# Patient Record
Sex: Female | Born: 1950 | Race: White | Hispanic: No | Marital: Married | Smoking: Former smoker
Health system: Southern US, Community
[De-identification: ages and names within clinical notes are randomized; demographics above are authoritative.]

## PROBLEM LIST (undated history)

## (undated) DIAGNOSIS — C801 Malignant (primary) neoplasm, unspecified: Secondary | ICD-10-CM

## (undated) DIAGNOSIS — L438 Other lichen planus: Secondary | ICD-10-CM

## (undated) DIAGNOSIS — M810 Age-related osteoporosis without current pathological fracture: Secondary | ICD-10-CM

## (undated) HISTORY — PX: RETINAL TEAR REPAIR CRYOTHERAPY: SHX5304

## (undated) HISTORY — DX: Other lichen planus: L43.8

## (undated) HISTORY — PX: TONSILLECTOMY AND ADENOIDECTOMY: SHX28

## (undated) HISTORY — PX: SKIN CANCER EXCISION: SHX779

## (undated) HISTORY — DX: Age-related osteoporosis without current pathological fracture: M81.0

## (undated) HISTORY — DX: Malignant (primary) neoplasm, unspecified: C80.1

---

## 1978-06-24 HISTORY — PX: TUBAL LIGATION: SHX77

## 1988-06-24 HISTORY — PX: OTHER SURGICAL HISTORY: SHX169

## 1999-03-30 ENCOUNTER — Other Ambulatory Visit: Admission: RE | Admit: 1999-03-30 | Discharge: 1999-03-30 | Payer: Self-pay | Admitting: Family Medicine

## 1999-05-02 ENCOUNTER — Encounter: Payer: Self-pay | Admitting: Family Medicine

## 1999-05-02 ENCOUNTER — Encounter: Admission: RE | Admit: 1999-05-02 | Discharge: 1999-05-02 | Payer: Self-pay | Admitting: Family Medicine

## 1999-08-08 ENCOUNTER — Encounter: Admission: RE | Admit: 1999-08-08 | Discharge: 1999-08-08 | Payer: Self-pay | Admitting: Family Medicine

## 1999-08-08 ENCOUNTER — Encounter: Payer: Self-pay | Admitting: Family Medicine

## 1999-08-23 ENCOUNTER — Other Ambulatory Visit: Admission: RE | Admit: 1999-08-23 | Discharge: 1999-08-23 | Payer: Self-pay | Admitting: Obstetrics and Gynecology

## 1999-08-23 ENCOUNTER — Encounter (INDEPENDENT_AMBULATORY_CARE_PROVIDER_SITE_OTHER): Payer: Self-pay | Admitting: Specialist

## 2000-05-23 ENCOUNTER — Other Ambulatory Visit: Admission: RE | Admit: 2000-05-23 | Discharge: 2000-05-23 | Payer: Self-pay | Admitting: Obstetrics and Gynecology

## 2000-06-25 ENCOUNTER — Encounter: Admission: RE | Admit: 2000-06-25 | Discharge: 2000-06-25 | Payer: Self-pay | Admitting: Family Medicine

## 2000-06-25 ENCOUNTER — Encounter: Payer: Self-pay | Admitting: Family Medicine

## 2000-11-28 ENCOUNTER — Ambulatory Visit (HOSPITAL_COMMUNITY): Admission: RE | Admit: 2000-11-28 | Discharge: 2000-11-28 | Payer: Self-pay | Admitting: *Deleted

## 2001-07-23 ENCOUNTER — Other Ambulatory Visit: Admission: RE | Admit: 2001-07-23 | Discharge: 2001-07-23 | Payer: Self-pay | Admitting: Obstetrics and Gynecology

## 2001-08-28 ENCOUNTER — Encounter: Admission: RE | Admit: 2001-08-28 | Discharge: 2001-08-28 | Payer: Self-pay | Admitting: Obstetrics and Gynecology

## 2001-08-28 ENCOUNTER — Encounter: Payer: Self-pay | Admitting: Obstetrics and Gynecology

## 2001-12-20 ENCOUNTER — Emergency Department (HOSPITAL_COMMUNITY): Admission: EM | Admit: 2001-12-20 | Discharge: 2001-12-20 | Payer: Self-pay | Admitting: Emergency Medicine

## 2002-07-30 ENCOUNTER — Other Ambulatory Visit: Admission: RE | Admit: 2002-07-30 | Discharge: 2002-07-30 | Payer: Self-pay | Admitting: Obstetrics and Gynecology

## 2002-09-03 ENCOUNTER — Encounter: Payer: Self-pay | Admitting: Obstetrics and Gynecology

## 2002-09-03 ENCOUNTER — Encounter: Admission: RE | Admit: 2002-09-03 | Discharge: 2002-09-03 | Payer: Self-pay | Admitting: Obstetrics and Gynecology

## 2003-08-26 ENCOUNTER — Other Ambulatory Visit: Admission: RE | Admit: 2003-08-26 | Discharge: 2003-08-26 | Payer: Self-pay | Admitting: Obstetrics and Gynecology

## 2003-09-09 ENCOUNTER — Encounter: Admission: RE | Admit: 2003-09-09 | Discharge: 2003-09-09 | Payer: Self-pay | Admitting: Obstetrics and Gynecology

## 2004-09-14 ENCOUNTER — Other Ambulatory Visit: Admission: RE | Admit: 2004-09-14 | Discharge: 2004-09-14 | Payer: Self-pay | Admitting: Obstetrics and Gynecology

## 2004-09-14 ENCOUNTER — Encounter: Admission: RE | Admit: 2004-09-14 | Discharge: 2004-09-14 | Payer: Self-pay | Admitting: Obstetrics and Gynecology

## 2005-01-10 ENCOUNTER — Encounter: Admission: RE | Admit: 2005-01-10 | Discharge: 2005-01-10 | Payer: Self-pay | Admitting: Obstetrics and Gynecology

## 2005-10-09 ENCOUNTER — Other Ambulatory Visit: Admission: RE | Admit: 2005-10-09 | Discharge: 2005-10-09 | Payer: Self-pay | Admitting: Obstetrics & Gynecology

## 2006-01-22 ENCOUNTER — Encounter: Admission: RE | Admit: 2006-01-22 | Discharge: 2006-01-22 | Payer: Self-pay | Admitting: Obstetrics and Gynecology

## 2006-01-29 ENCOUNTER — Encounter: Admission: RE | Admit: 2006-01-29 | Discharge: 2006-01-29 | Payer: Self-pay | Admitting: Obstetrics and Gynecology

## 2006-10-30 ENCOUNTER — Other Ambulatory Visit: Admission: RE | Admit: 2006-10-30 | Discharge: 2006-10-30 | Payer: Self-pay | Admitting: Obstetrics and Gynecology

## 2007-02-27 ENCOUNTER — Encounter: Admission: RE | Admit: 2007-02-27 | Discharge: 2007-02-27 | Payer: Self-pay | Admitting: Obstetrics and Gynecology

## 2007-12-08 ENCOUNTER — Other Ambulatory Visit: Admission: RE | Admit: 2007-12-08 | Discharge: 2007-12-08 | Payer: Self-pay | Admitting: Obstetrics and Gynecology

## 2008-03-15 ENCOUNTER — Encounter: Admission: RE | Admit: 2008-03-15 | Discharge: 2008-03-15 | Payer: Self-pay | Admitting: Obstetrics and Gynecology

## 2009-03-21 ENCOUNTER — Encounter: Admission: RE | Admit: 2009-03-21 | Discharge: 2009-03-21 | Payer: Self-pay | Admitting: Internal Medicine

## 2009-03-22 ENCOUNTER — Encounter: Admission: RE | Admit: 2009-03-22 | Discharge: 2009-03-22 | Payer: Self-pay | Admitting: Internal Medicine

## 2010-03-22 ENCOUNTER — Encounter: Admission: RE | Admit: 2010-03-22 | Discharge: 2010-03-22 | Payer: Self-pay | Admitting: Obstetrics and Gynecology

## 2010-07-15 ENCOUNTER — Encounter: Payer: Self-pay | Admitting: Obstetrics and Gynecology

## 2010-07-16 ENCOUNTER — Encounter: Payer: Self-pay | Admitting: Internal Medicine

## 2011-02-27 ENCOUNTER — Other Ambulatory Visit: Payer: Self-pay | Admitting: Obstetrics and Gynecology

## 2011-02-27 DIAGNOSIS — Z1231 Encounter for screening mammogram for malignant neoplasm of breast: Secondary | ICD-10-CM

## 2011-03-25 ENCOUNTER — Ambulatory Visit
Admission: RE | Admit: 2011-03-25 | Discharge: 2011-03-25 | Disposition: A | Discharge status: Home / Self Care | Payer: 59 | Source: Ambulatory Visit | Attending: Obstetrics and Gynecology | Admitting: Obstetrics and Gynecology

## 2011-03-25 DIAGNOSIS — Z1231 Encounter for screening mammogram for malignant neoplasm of breast: Secondary | ICD-10-CM

## 2012-03-03 ENCOUNTER — Other Ambulatory Visit: Payer: Self-pay | Admitting: Obstetrics and Gynecology

## 2012-03-03 DIAGNOSIS — M810 Age-related osteoporosis without current pathological fracture: Secondary | ICD-10-CM

## 2012-03-12 ENCOUNTER — Other Ambulatory Visit: Payer: Self-pay | Admitting: Obstetrics and Gynecology

## 2012-03-12 DIAGNOSIS — Z1231 Encounter for screening mammogram for malignant neoplasm of breast: Secondary | ICD-10-CM

## 2012-03-24 DIAGNOSIS — M810 Age-related osteoporosis without current pathological fracture: Secondary | ICD-10-CM

## 2012-03-24 HISTORY — DX: Age-related osteoporosis without current pathological fracture: M81.0

## 2012-04-14 ENCOUNTER — Ambulatory Visit
Admission: RE | Admit: 2012-04-14 | Discharge: 2012-04-14 | Disposition: A | Discharge status: Home / Self Care | Payer: 59 | Source: Ambulatory Visit | Attending: Obstetrics and Gynecology | Admitting: Obstetrics and Gynecology

## 2012-04-14 DIAGNOSIS — Z1231 Encounter for screening mammogram for malignant neoplasm of breast: Secondary | ICD-10-CM

## 2012-04-14 DIAGNOSIS — M810 Age-related osteoporosis without current pathological fracture: Secondary | ICD-10-CM

## 2013-01-25 ENCOUNTER — Encounter: Payer: Self-pay | Admitting: *Deleted

## 2013-01-25 ENCOUNTER — Ambulatory Visit (INDEPENDENT_AMBULATORY_CARE_PROVIDER_SITE_OTHER): Payer: 59 | Admitting: Obstetrics and Gynecology

## 2013-01-25 ENCOUNTER — Encounter: Payer: Self-pay | Admitting: Obstetrics and Gynecology

## 2013-01-25 VITALS — BP 120/70 | HR 80 | Resp 20 | Ht 64.0 in | Wt 134.0 lb

## 2013-01-25 DIAGNOSIS — Z01419 Encounter for gynecological examination (general) (routine) without abnormal findings: Secondary | ICD-10-CM

## 2013-01-25 MED ORDER — RISEDRONATE SODIUM 35 MG PO TABS: 35.0000 mg | ORAL_TABLET | ORAL | Status: DC

## 2013-01-25 MED ORDER — ESTROGENS, CONJUGATED 0.625 MG/GM VA CREA: TOPICAL_CREAM | VAGINAL | Status: DC

## 2013-01-25 NOTE — Progress Notes (Signed)
62 y.o.   Married    Caucasian   female   G2P2002   here for annual exam.  Doing fine back on actonel.  Takes vit D qd.  Does exercise.    Patient's last menstrual period was 06/24/2002.          Sexually active: yes  The current method of family planning is tubal ligation and post menopausal status.    Exercising: walking dog twice a day, gardening Last mammogram:  04/14/12 normal Last pap smear: 01/03/10 neg History of abnormal pap: no Smoking: quit smoking 25 years ago Alcohol: 2-3 glasses of wine a week Last colonoscopy:2012 normal, repeat in 5 years (family history) Last Bone Density: 04/14/12 osteopenia, osteoporosis of the hip  Last tetanus shot: 2010 Last cholesterol check: 2013 total around 200  Hgb:  pcp              Urine: pcp   Family History  Problem Relation Age of Onset  . Hypertension Mother   . Atrial fibrillation Mother   . Dementia Mother   . Hypertension Father   . Cancer Father     colon  . Diabetes Maternal Grandmother   . Cancer Paternal Grandmother     There are no active problems to display for this patient.   Past Medical History  Diagnosis Date  . Osteoporosis 03-2012    T-score 1.5/2.8     Past Surgical History  Procedure Laterality Date  . Tubal ligation  1980  . Salivatory gland  1990    Blocked salivatory gland removed  . Tonsillectomy and adenoidectomy  Age 70    Allergies: Review of patient's allergies indicates no known allergies.  Current Outpatient Prescriptions  Medication Sig Dispense Refill  . calcium carbonate (OS-CAL) 600 MG TABS tablet Take 600 mg by mouth daily.       . cholecalciferol (VITAMIN D) 1000 UNITS tablet Take 2,000 Units by mouth daily.      Marland Kitchen conjugated estrogens (PREMARIN) vaginal cream Place vaginally 3 (three) times a week.      . diclofenac (VOLTAREN) 75 MG EC tablet       . lansoprazole (PREVACID) 15 MG capsule Take 15 mg by mouth as needed.      . predniSONE (DELTASONE) 10 MG tablet       . ACTONEL  35 MG tablet once a week.       . fish oil-omega-3 fatty acids 1000 MG capsule Take 1,500 mg by mouth daily.       No current facility-administered medications for this visit.  Likes her premarin cream.  Having hip pain and is on a prednisone dose pack for ? bursitis  ROS: Pertinent items are noted in HPI.  Social Hx:  Married, two children, retired Set designer  Exam:    BP 120/70  Pulse 80  Resp 20  Ht 5\' 4"  (1.626 m)  Wt 134 lb (60.782 kg)  BMI 22.99 kg/m2  LMP 01/01/2004ht stable, wt down 1 pound from last year   Wt Readings from Last 3 Encounters:  01/25/13 134 lb (60.782 kg)     Ht Readings from Last 3 Encounters:  01/25/13 5\' 4"  (1.626 m)    General appearance: alert, cooperative and appears stated age Head: Normocephalic, without obvious abnormality, atraumatic Neck: no adenopathy, supple, symmetrical, trachea midline and thyroid not enlarged, symmetric, no tenderness/mass/nodules Lungs: clear to auscultation bilaterally Breasts: Inspection negative, No nipple retraction or dimpling, No nipple discharge or bleeding, No axillary or supraclavicular  adenopathy, Normal to palpation without dominant masses Heart: regular rate and rhythm Abdomen: soft, non-tender; bowel sounds normal; no masses,  no organomegaly Extremities: extremities normal, atraumatic, no cyanosis or edema Skin: Skin color, texture, turgor normal. No rashes or lesions Lymph nodes: Cervical, supraclavicular, and axillary nodes normal. No abnormal inguinal nodes palpated Neurologic: Grossly normal   Pelvic: External genitalia:  no lesions              Urethra:  normal appearing urethra with no masses, tenderness or lesions              Bartholins and Skenes: normal                 Vagina: normal appearing vagina with normal color and discharge, no lesions              Cervix: normal appearance              Pap taken: yes        Bimanual Exam:  Uterus:  uterus is normal size, shape, consistency and  nontender, mid, mobile                                      Adnexa: normal adnexa in size, nontender and no masses                                      Rectovaginal: Confirms                                      Anus:  normal sphincter tone, no lesions  A: normal menopausal exam, no HRT     Osteoporosis, on actonel ( Quite in 2010 after 10 years of use;restarted in 2013 when left hip t score was -2.8)     P:     mammogram pap smear counseled on breast self exam, mammography screening, adequate intake of calcium and vitamin D, diet and exercise return annually or prn     An After Visit Summary was printed and given to the patient.

## 2013-01-25 NOTE — Patient Instructions (Signed)

## 2013-03-16 ENCOUNTER — Other Ambulatory Visit: Payer: Self-pay

## 2013-03-16 DIAGNOSIS — Z1231 Encounter for screening mammogram for malignant neoplasm of breast: Secondary | ICD-10-CM

## 2013-04-15 ENCOUNTER — Ambulatory Visit
Admission: RE | Admit: 2013-04-15 | Discharge: 2013-04-15 | Disposition: A | Discharge status: Home / Self Care | Payer: 59 | Source: Ambulatory Visit

## 2013-04-15 DIAGNOSIS — Z1231 Encounter for screening mammogram for malignant neoplasm of breast: Secondary | ICD-10-CM

## 2013-04-29 ENCOUNTER — Other Ambulatory Visit: Payer: Self-pay

## 2014-01-26 ENCOUNTER — Encounter: Payer: Self-pay | Admitting: Obstetrics and Gynecology

## 2014-01-26 ENCOUNTER — Ambulatory Visit (INDEPENDENT_AMBULATORY_CARE_PROVIDER_SITE_OTHER): Payer: 59 | Admitting: Obstetrics and Gynecology

## 2014-01-26 VITALS — BP 102/76 | HR 82 | Resp 14 | Ht 64.25 in | Wt 134.6 lb

## 2014-01-26 DIAGNOSIS — Z01419 Encounter for gynecological examination (general) (routine) without abnormal findings: Secondary | ICD-10-CM

## 2014-01-26 DIAGNOSIS — Z0000 Encounter for general adult medical examination without abnormal findings: Secondary | ICD-10-CM

## 2014-01-26 DIAGNOSIS — M810 Age-related osteoporosis without current pathological fracture: Secondary | ICD-10-CM

## 2014-01-26 LAB — POCT URINALYSIS DIPSTICK
Bilirubin, UA: NEGATIVE
Blood, UA: NEGATIVE
GLUCOSE UA: NEGATIVE
Ketones, UA: NEGATIVE
LEUKOCYTES UA: NEGATIVE
NITRITE UA: NEGATIVE
Protein, UA: NEGATIVE
Urobilinogen, UA: NEGATIVE
pH, UA: 6

## 2014-01-26 MED ORDER — RISEDRONATE SODIUM 35 MG PO TABS: 35.0000 mg | ORAL_TABLET | ORAL | Status: DC

## 2014-01-26 MED ORDER — ESTROGENS, CONJUGATED 0.625 MG/GM VA CREA: TOPICAL_CREAM | VAGINAL | Status: DC

## 2014-01-26 NOTE — Patient Instructions (Signed)

## 2014-01-26 NOTE — Progress Notes (Signed)
Patient ID: Hannah Jennings, female   DOB: 1951/04/09, 63 y.o.   MRN: [id number] GYNECOLOGY VISIT  PCP:   Collier Flowers, MD  Referring provider:   HPI: 63 y.o.   Married  Caucasian  female   G2P2002 with Patient's last menstrual period was 06/24/2002.   here for   AEX. Takes Actonel for osteoporosis of the hip. Took Actonel for many years and then did a drug holiday. Restarted in 2013.   Using Premarin vaginal cream for dryness.  Uses once a week.  Still has some hot flashes and night sweats.   Hgb:    PCP Urine:  Neg  GYNECOLOGIC HISTORY: Patient's last menstrual period was 06/24/2002. Sexually active:  yes Partner preference: female Contraception: Tubal   Menopausal hormone therapy: Premarin vaginal cream DES exposure:   no Blood transfusions:  no  Sexually transmitted diseases:  no GYN procedures and prior surgeries:  Tubal ligation Last mammogram:  04-15-13 fibroglandular density, otherwise normal:[facility]               Last pap and high risk HPV testing:   01-25-13 wnl:neg HR HPV  History of abnormal pap smear:  no   OB History   Grav Para Term Preterm Abortions TAB SAB Ect Mult Living   2 2 2       2        LIFESTYLE: Exercise:  no           Tobacco: no Alcohol:   4-5 glasses of wine per week Drug use:   no  OTHER HEALTH MAINTENANCE: Tetanus/TDap:  11/2007 Gardisil:             n/a Influenza:          03/2013 Zostavax:           2014  Bone density:  03-03-12 osteoporosis:[facility].  T score spine - 1.5, T score hip -2.8. Colonoscopy:  2012 normal with Dr. Cristina Gong.  Next colonoscopy due 2017 due to family history of colon cancer in her father.  Cholesterol check:   borderline  Family History  Problem Relation Age of Onset  . Hypertension Mother   . Atrial fibrillation Mother   . Dementia Mother   . Hypertension Father   . Cancer Father     colon  . Diabetes Maternal Grandmother   . Cancer Paternal Grandmother     There are no  active problems to display for this patient.  Past Medical History  Diagnosis Date  . Osteoporosis 03-2012    T-score 1.5/2.8     Past Surgical History  Procedure Laterality Date  . Tubal ligation  1980  . Salivatory gland  1990    Blocked salivatory gland removed  . Tonsillectomy and adenoidectomy  Age 67    ALLERGIES: Review of patient's allergies indicates no known allergies.  Current Outpatient Prescriptions  Medication Sig Dispense Refill  . calcium carbonate (OS-CAL) 600 MG TABS tablet Take 600 mg by mouth daily.       . cholecalciferol (VITAMIN D) 1000 UNITS tablet Take 2,000 Units by mouth daily.      Marland Kitchen conjugated estrogens (PREMARIN) vaginal cream Place vaginally 3 (three) times a week.  42.5 g  4  . lansoprazole (PREVACID) 15 MG capsule Take 15 mg by mouth as needed.      . risedronate (ACTONEL) 35 MG tablet Take 1 tablet (35 mg total) by mouth once a week.  4 tablet  12   No current  facility-administered medications for this visit.     ROS:  Pertinent items are noted in HPI.  SOCIAL HISTORY:  Retired. RN.  Worked in Librarian, academic.   PHYSICAL EXAMINATION:    BP 102/76  Pulse 82  Resp 14  Ht 5' 4.25" (1.632 m)  Wt 134 lb 9.6 oz (61.054 kg)  BMI 22.92 kg/m2  LMP 06/24/2002   Wt Readings from Last 3 Encounters:  01/26/14 134 lb 9.6 oz (61.054 kg)  01/25/13 134 lb (60.782 kg)     Ht Readings from Last 3 Encounters:  01/26/14 5' 4.25" (1.632 m)  01/25/13 5\' 4"  (1.626 m)    General appearance: alert, cooperative and appears stated age Head: Normocephalic, without obvious abnormality, atraumatic Neck: no adenopathy, supple, symmetrical, trachea midline and thyroid not enlarged, symmetric, no tenderness/mass/nodules Lungs: clear to auscultation bilaterally Breasts: Inspection negative, No nipple retraction or dimpling, No nipple discharge or bleeding, No axillary or supraclavicular adenopathy, Normal to palpation without dominant masses Heart: regular rate and  rhythm Abdomen: soft, non-tender; no masses,  no organomegaly Extremities: extremities normal, atraumatic, no cyanosis or edema Skin: Skin color, texture, turgor normal. No rashes or lesions Lymph nodes: Cervical, supraclavicular, and axillary nodes normal. No abnormal inguinal nodes palpated Neurologic: Grossly normal  Pelvic: External genitalia:  no lesions              Urethra:  normal appearing urethra with no masses, tenderness or lesions              Bartholins and Skenes: normal                 Vagina: normal appearing vagina with normal color and discharge, no lesions              Cervix: normal appearance              Pap and high risk HPV testing done: No.            Bimanual Exam:  Uterus:  uterus is normal size, shape, consistency and nontender                                      Adnexa: normal adnexa in size, nontender and no masses                                      Rectovaginal: Confirms                                      Anus:  normal sphincter tone, no lesions  ASSESSMENT  Normal gynecologic exam. Osteoporosis.  Atrophic vaginal changes.   PLAN  Mammogram recommended yearly.  Due this fall.  Pap smear and high risk HPV testing not indicated. Bone density in the fall 2015.  Refill Actonel 35 mg weekly.  See orders.  Counseled on self breast exam, Calcium and vitamin D intake, exercise. Check Vit D level.  Refill Premarin cream 1/2 gram 2 - 3 times weekly.  See orders.  Return annually or prn   An After Visit Summary was printed and given to the patient.

## 2014-01-27 LAB — VITAMIN D 25 HYDROXY (VIT D DEFICIENCY, FRACTURES): VIT D 25 HYDROXY: 57 ng/mL (ref 30–89)

## 2014-03-18 LAB — CBC AND DIFFERENTIAL
HCT: 42 % (ref 36–46)
Hemoglobin: 13.7 g/dL (ref 12.0–16.0)
Platelets: 282 10*3/uL (ref 150–399)
WBC: 5 10*3/mL

## 2014-03-18 LAB — BASIC METABOLIC PANEL
BUN: 20 mg/dL (ref 4–21)
CREATININE: 0.9 mg/dL (ref 0.5–1.1)
GLUCOSE: 94 mg/dL
POTASSIUM: 4.8 mmol/L (ref 3.4–5.3)
Sodium: 137 mmol/L (ref 137–147)

## 2014-03-18 LAB — TSH: TSH: 1.49 u[IU]/mL (ref 0.41–5.90)

## 2014-03-22 LAB — LIPID PANEL
CHOLESTEROL: 209 mg/dL — AB (ref 0–200)
HDL: 68 mg/dL (ref 35–70)
LDL Cholesterol: 122 mg/dL
TRIGLYCERIDES: 91 mg/dL (ref 40–160)

## 2014-03-22 LAB — TSH: TSH: 1.49 u[IU]/mL (ref 0.41–5.90)

## 2014-04-08 ENCOUNTER — Other Ambulatory Visit: Payer: Self-pay

## 2014-04-18 ENCOUNTER — Other Ambulatory Visit: Payer: Self-pay | Admitting: Obstetrics and Gynecology

## 2014-04-18 DIAGNOSIS — Z1231 Encounter for screening mammogram for malignant neoplasm of breast: Secondary | ICD-10-CM

## 2014-04-25 ENCOUNTER — Encounter: Payer: Self-pay | Admitting: Obstetrics and Gynecology

## 2014-05-11 ENCOUNTER — Ambulatory Visit
Admission: RE | Admit: 2014-05-11 | Discharge: 2014-05-11 | Disposition: A | Discharge status: Home / Self Care | Payer: 59 | Source: Ambulatory Visit | Attending: Obstetrics and Gynecology | Admitting: Obstetrics and Gynecology

## 2014-05-11 DIAGNOSIS — Z1231 Encounter for screening mammogram for malignant neoplasm of breast: Secondary | ICD-10-CM

## 2014-05-11 DIAGNOSIS — M810 Age-related osteoporosis without current pathological fracture: Secondary | ICD-10-CM

## 2015-02-01 ENCOUNTER — Ambulatory Visit (INDEPENDENT_AMBULATORY_CARE_PROVIDER_SITE_OTHER): Payer: Medicare Other | Admitting: Obstetrics and Gynecology

## 2015-02-01 ENCOUNTER — Encounter: Payer: Self-pay | Admitting: Obstetrics and Gynecology

## 2015-02-01 ENCOUNTER — Ambulatory Visit: Payer: 59 | Admitting: Obstetrics and Gynecology

## 2015-02-01 DIAGNOSIS — E559 Vitamin D deficiency, unspecified: Secondary | ICD-10-CM | POA: Diagnosis not present

## 2015-02-01 DIAGNOSIS — Z01419 Encounter for gynecological examination (general) (routine) without abnormal findings: Secondary | ICD-10-CM

## 2015-02-01 DIAGNOSIS — Z0000 Encounter for general adult medical examination without abnormal findings: Secondary | ICD-10-CM | POA: Diagnosis not present

## 2015-02-01 LAB — POCT URINALYSIS DIPSTICK
Bilirubin, UA: NEGATIVE
Blood, UA: NEGATIVE
GLUCOSE UA: NEGATIVE
KETONES UA: NEGATIVE
Leukocytes, UA: NEGATIVE
Nitrite, UA: NEGATIVE
Protein, UA: NEGATIVE
UROBILINOGEN UA: NEGATIVE
pH, UA: 5

## 2015-02-01 MED ORDER — RISEDRONATE SODIUM 35 MG PO TABS: 35.0000 mg | ORAL_TABLET | ORAL | Status: DC

## 2015-02-01 MED ORDER — ESTROGENS, CONJUGATED 0.625 MG/GM VA CREA: TOPICAL_CREAM | VAGINAL | Status: DC

## 2015-02-01 NOTE — Patient Instructions (Signed)

## 2015-02-01 NOTE — Progress Notes (Signed)
Patient ID: Hannah Jennings, female   DOB: [birth year], 64 y.o.   MRN: [id number] 64 y.o. G56P2002 [name] here for annual exam.   Osteoporosis.  On Actonel.  Had bone density 04/2015.  Using Premarin vaginal cream.   Has a new grand daughter.  This is the first grandchild.  PCP:  Lowella Dell, MD  Patient's last menstrual period was 06/24/2002.          Sexually active: Yes.   female The current method of family planning is tubal ligation.    Exercising: Yes.    goes to gym 3x/week. Smoker:  no  Health Maintenance: Pap:  01-25-13 Neg:Neg HR HPV History of abnormal Pap:  no MMG:  05-11-14 Density Cat.B/Neg:[facility] Colonoscopy:  2012 normal with Dr. Cristina Gong.  Next due 2017 due to family history of colon cancer in father. BMD:   05-11-14  Result  Osteoporosis--stable spine/hip 10% improved--Txment with Actonel:[facility] TDaP:  11/2007 Screening Labs:  Hb today: PCP, Urine today: Neg   reports that she quit smoking about 27 years ago. She has never used smokeless tobacco. She reports that she drinks about 2.0 - 2.5 oz of alcohol per week. She reports that she does not use illicit drugs.  Past Medical History  Diagnosis Date  . Osteoporosis 03-2012    T-score 1.5/2.8   . Oral lichen planus     --hx of oral--pt. states for years    Past Surgical History  Procedure Laterality Date  . Tubal ligation  1980  . Salivatory gland  1990    Blocked salivatory gland removed  . Tonsillectomy and adenoidectomy  Age 34    Current Outpatient Prescriptions  Medication Sig Dispense Refill  . calcium carbonate (TUMS - DOSED IN MG ELEMENTAL CALCIUM) 500 MG chewable tablet Chew 1 tablet by mouth daily.    . cholecalciferol (VITAMIN D) 1000 UNITS tablet Take 1,000 Units by mouth daily.     Marland Kitchen conjugated estrogens (PREMARIN) vaginal cream Use 1/2 gram in vagina 2 - 3 times a week to control symptoms. 42.5 g 2  . famotidine (PEPCID) 20 MG tablet Take 20 mg by mouth  daily.    Marland Kitchen ibuprofen (ADVIL,MOTRIN) 600 MG tablet Take 600 mg by mouth at bedtime.    Marland Kitchen neomycin-bacitracin-polymyxin (NEOSPORIN) ophthalmic ointment 1 application as needed.    . risedronate (ACTONEL) 35 MG tablet Take 1 tablet (35 mg total) by mouth once a week. 4 tablet 12  . tobramycin (TOBREX) 0.3 % ophthalmic ointment 1 application as needed.     No current facility-administered medications for this visit.    Family History  Problem Relation Age of Onset  . Hypertension Mother   . Atrial fibrillation Mother   . Dementia Mother   . Hypertension Father   . Cancer Father     colon  . Diabetes Maternal Grandmother   . Cancer Paternal Grandmother   . Neuropathy Sister     ROS:  Pertinent items are noted in HPI.  Otherwise, a comprehensive ROS was negative.  Exam:   LMP 06/24/2002    General appearance: alert, cooperative and appears stated age Head: Normocephalic, without obvious abnormality, atraumatic Neck: no adenopathy, supple, symmetrical, trachea midline and thyroid normal to inspection and palpation Lungs: clear to auscultation bilaterally Breasts: normal appearance, no masses or tenderness, Inspection negative, No nipple retraction or dimpling, No nipple discharge or bleeding, No axillary or supraclavicular adenopathy Heart: regular rate and rhythm Abdomen: soft, non-tender; bowel sounds  normal; no masses,  no organomegaly Extremities: extremities normal, atraumatic, no cyanosis or edema Skin: Skin color, texture, turgor normal. No rashes or lesions Lymph nodes: Cervical, supraclavicular, and axillary nodes normal. No abnormal inguinal nodes palpated Neurologic: Grossly normal  Pelvic: External genitalia:  no lesions              Urethra:  normal appearing urethra with no masses, tenderness or lesions              Bartholins and Skenes: normal                 Vagina: normal appearing vagina with normal color and discharge, no lesions              Cervix: no  lesions              Pap taken: Yes.   Bimanual Exam:  Uterus:  normal size, contour, position, consistency, mobility, non-tender              Adnexa: normal adnexa and no mass, fullness, tenderness              Rectovaginal: Yes.  .  Confirms.              Anus:  normal sphincter tone, no lesions  Chaperone was present for exam.  Assessment:   Well woman visit with normal exam. Osteoporosis.  Hx vit D deficiency.  Atrophy of vagina.   Plan: Yearly mammogram recommended after age 52.  Recommended self breast exam.  Pap and HR HPV as above. Discussed Calcium, Vitamin D, regular exercise program including cardiovascular and weight bearing exercise. Labs performed.  Yes.  .   See orders.  Vit D level.  Refills given on medications.  Yes.  .  See orders.  Actonel and Premarin vaginal cream. Discussed risks of Premarin cream - DVT, PE, MI, stroke, breast cancer.  Bone density in 2017. Follow up annually and prn.      After visit summary provided.

## 2015-02-02 LAB — VITAMIN D 25 HYDROXY (VIT D DEFICIENCY, FRACTURES): Vit D, 25-Hydroxy: 37 ng/mL (ref 30–100)

## 2015-02-03 LAB — IPS PAP SMEAR ONLY

## 2015-03-20 LAB — CBC AND DIFFERENTIAL
HEMATOCRIT: 39 % (ref 36–46)
HEMOGLOBIN: 13.5 g/dL (ref 12.0–16.0)
Platelets: 221 10*3/uL (ref 150–399)
WBC: 4.1 10^3/mL

## 2015-03-20 LAB — LIPID PANEL
CHOLESTEROL: 202 mg/dL — AB (ref 0–200)
HDL: 55 mg/dL (ref 35–70)
LDL CALC: 124 mg/dL
TRIGLYCERIDES: 115 mg/dL (ref 40–160)

## 2015-03-20 LAB — BASIC METABOLIC PANEL
BUN: 19 mg/dL (ref 4–21)
Creatinine: 0.8 mg/dL (ref 0.5–1.1)
GLUCOSE: 101 mg/dL
Potassium: 4.2 mmol/L (ref 3.4–5.3)
SODIUM: 140 mmol/L (ref 137–147)

## 2015-03-20 LAB — HEPATIC FUNCTION PANEL
ALT: 14 U/L (ref 7–35)
AST: 17 U/L (ref 13–35)
BILIRUBIN, TOTAL: 0.5 mg/dL

## 2015-04-13 ENCOUNTER — Other Ambulatory Visit: Payer: Self-pay

## 2015-04-13 DIAGNOSIS — Z1231 Encounter for screening mammogram for malignant neoplasm of breast: Secondary | ICD-10-CM

## 2015-05-15 ENCOUNTER — Ambulatory Visit
Admission: RE | Admit: 2015-05-15 | Discharge: 2015-05-15 | Disposition: A | Discharge status: Home / Self Care | Payer: Medicare Other | Source: Ambulatory Visit

## 2015-05-15 DIAGNOSIS — Z1231 Encounter for screening mammogram for malignant neoplasm of breast: Secondary | ICD-10-CM

## 2016-02-05 ENCOUNTER — Other Ambulatory Visit: Payer: Self-pay | Admitting: Obstetrics and Gynecology

## 2016-02-05 NOTE — Telephone Encounter (Signed)
Medication refill request: Actonel Last AEX:  02-01-15 Next AEX: 02-14-16 Last MMG (if hormonal medication request): 05-15-15 WNL Refill authorized: please advise

## 2016-02-12 NOTE — Progress Notes (Signed)
65 y.o. G48P2002 [name] here for annual exam.    Having leg aching for 6 months only at night.  Takes Ibuprofen at night due to the pain, which wakes her up.  This is not joint pain.  Has been working out a lot.   Takes Actonel.   PCP:   Dr. Betty Martinique - will be establishing care in September, 2017.  Patient's last menstrual period was 06/24/2002.           Sexually active: Yes.    The current method of family planning is tubal ligation.    Exercising: Yes.    walking dog 2x daily, 1/2 - 1 mile Smoker:  no  Health Maintenance: Pap:  02-01-15 Neg History of abnormal Pap:  no MMG:  05-15-15 3D/Density B/Neg/BiRads1:[facility] Colonoscopy: 2012 normal with Dr. Fredia Beets due 2017 due to family history of colon cancer in father.  BMD:   05-11-14  Result  Osteoporosis TDaP:  11/2007 Gardasil:   N/A HIV:  Declines. Hep C:  Declines.   Screening Labs:   Urine today: normal   reports that she quit smoking about 28 years ago. She has never used smokeless tobacco. She reports that she drinks about 2.0 - 2.5 oz of alcohol per week . She reports that she does not use drugs.  Past Medical History:  Diagnosis Date  . Oral lichen planus    --hx of oral--pt. states for years  . Osteoporosis 03-2012   T-score 1.5/2.8     Past Surgical History:  Procedure Laterality Date  . salivatory gland  1990   Blocked salivatory gland removed  . TONSILLECTOMY AND ADENOIDECTOMY  Age 26  . TUBAL LIGATION  1980    Current Outpatient Prescriptions  Medication Sig Dispense Refill  . calcium carbonate (TUMS - DOSED IN MG ELEMENTAL CALCIUM) 500 MG chewable tablet Chew 1 tablet by mouth daily.    . cholecalciferol (VITAMIN D) 1000 UNITS tablet Take 1,000 Units by mouth daily.     Marland Kitchen conjugated estrogens (PREMARIN) vaginal cream Use 1/2 gram in vagina 2 - 3 times a week to control symptoms. 42.5 g 2  . famotidine (PEPCID) 20 MG tablet Take 20 mg by mouth daily.    Marland Kitchen ibuprofen  (ADVIL,MOTRIN) 600 MG tablet Take 600 mg by mouth at bedtime.    Marland Kitchen neomycin-bacitracin-polymyxin (NEOSPORIN) ophthalmic ointment 1 application as needed.    . risedronate (ACTONEL) 35 MG tablet take 1 tablet by mouth every week 12 tablet 3  . tobramycin (TOBREX) 0.3 % ophthalmic ointment 1 application as needed.     No current facility-administered medications for this visit.     Family History  Problem Relation Age of Onset  . Hypertension Mother   . Atrial fibrillation Mother   . Dementia Mother   . Hypertension Father   . Cancer Father     colon  . Diabetes Maternal Grandmother   . Cancer Paternal Grandmother   . Neuropathy Sister     ROS:  Pertinent items are noted in HPI.  Otherwise, a comprehensive ROS was negative.  Exam:   LMP 06/24/2002     General appearance: alert, cooperative and appears stated age Head: Normocephalic, without obvious abnormality, atraumatic Neck: no adenopathy, supple, symmetrical, trachea midline and thyroid normal to inspection and palpation Lungs: clear to auscultation bilaterally Breasts: normal appearance, no masses or tenderness, No nipple retraction or dimpling, No nipple discharge or bleeding, No axillary or supraclavicular adenopathy Heart: regular rate and rhythm  Abdomen: soft, non-tender; no masses, no organomegaly Extremities: extremities normal, atraumatic, no cyanosis or edema Skin: Skin color, texture, turgor normal. No rashes or lesions Lymph nodes: Cervical, supraclavicular, and axillary nodes normal. No abnormal inguinal nodes palpated Neurologic: Grossly normal  Pelvic: External genitalia:  no lesions              Urethra:  normal appearing urethra with no masses, tenderness or lesions              Bartholins and Skenes: normal                 Vagina: normal appearing vagina with normal color and discharge, no lesions              Cervix: no lesions              Pap taken: No. Bimanual Exam:  Uterus:  normal size, contour,  position, consistency, mobility, non-tender              Adnexa: no mass, fullness, tenderness              Rectal exam: Yes.  .  Confirms.              Anus:  normal sphincter tone, no lesions  Chaperone was present for exam.  Assessment:   Well woman visit with normal exam. Osteoporosis.  Bilateral leg pain. Hx vit D deficiency.  Atrophy of vagina.   Plan: Yearly mammogram recommended after age 1.  Recommended self breast exam.  Pap and HR HPV as above. Discussed Calcium, Vitamin D, regular exercise program including cardiovascular and weight bearing exercise. Continue Premarin cream.  See orders.  Discussed risks of DVT, PE, MI, stroke, and breast cancer.   Will stop Actonel due to leg pain to see if this is a contributing factor. BMD in November 2017.  Labs with new PCP.  Follow up annually and prn.       After visit summary provided.

## 2016-02-14 ENCOUNTER — Encounter: Payer: Self-pay | Admitting: Obstetrics and Gynecology

## 2016-02-14 ENCOUNTER — Ambulatory Visit (INDEPENDENT_AMBULATORY_CARE_PROVIDER_SITE_OTHER): Payer: Medicare Other | Admitting: Obstetrics and Gynecology

## 2016-02-14 VITALS — BP 120/78 | HR 72 | Resp 16 | Ht 64.25 in | Wt 136.0 lb

## 2016-02-14 DIAGNOSIS — M810 Age-related osteoporosis without current pathological fracture: Secondary | ICD-10-CM | POA: Diagnosis not present

## 2016-02-14 DIAGNOSIS — Z0000 Encounter for general adult medical examination without abnormal findings: Secondary | ICD-10-CM | POA: Diagnosis not present

## 2016-02-14 DIAGNOSIS — Z01419 Encounter for gynecological examination (general) (routine) without abnormal findings: Secondary | ICD-10-CM

## 2016-02-14 LAB — POCT URINALYSIS DIPSTICK
BILIRUBIN UA: NEGATIVE
Glucose, UA: NEGATIVE
Ketones, UA: NEGATIVE
LEUKOCYTES UA: NEGATIVE
NITRITE UA: NEGATIVE
PH UA: 6
PROTEIN UA: NEGATIVE
RBC UA: NEGATIVE
UROBILINOGEN UA: NEGATIVE

## 2016-02-14 MED ORDER — ESTROGENS, CONJUGATED 0.625 MG/GM VA CREA: TOPICAL_CREAM | VAGINAL | 2 refills | Status: DC

## 2016-02-14 NOTE — Patient Instructions (Signed)

## 2016-02-21 ENCOUNTER — Encounter: Payer: Self-pay | Admitting: Family Medicine

## 2016-03-18 ENCOUNTER — Ambulatory Visit (INDEPENDENT_AMBULATORY_CARE_PROVIDER_SITE_OTHER): Payer: Medicare Other | Admitting: Family Medicine

## 2016-03-18 ENCOUNTER — Encounter: Payer: Self-pay | Admitting: Family Medicine

## 2016-03-18 VITALS — BP 140/80 | HR 97 | Temp 98.3°F | Resp 12 | Ht 64.25 in | Wt 138.4 lb

## 2016-03-18 DIAGNOSIS — Z5181 Encounter for therapeutic drug level monitoring: Secondary | ICD-10-CM | POA: Diagnosis not present

## 2016-03-18 DIAGNOSIS — G4700 Insomnia, unspecified: Secondary | ICD-10-CM | POA: Diagnosis not present

## 2016-03-18 DIAGNOSIS — M25562 Pain in left knee: Secondary | ICD-10-CM

## 2016-03-18 DIAGNOSIS — Z23 Encounter for immunization: Secondary | ICD-10-CM

## 2016-03-18 DIAGNOSIS — F330 Major depressive disorder, recurrent, mild: Secondary | ICD-10-CM

## 2016-03-18 DIAGNOSIS — R5383 Other fatigue: Secondary | ICD-10-CM

## 2016-03-18 DIAGNOSIS — M25561 Pain in right knee: Secondary | ICD-10-CM | POA: Insufficient documentation

## 2016-03-18 DIAGNOSIS — F324 Major depressive disorder, single episode, in partial remission: Secondary | ICD-10-CM | POA: Insufficient documentation

## 2016-03-18 MED ORDER — DULOXETINE HCL 30 MG PO CPEP: 30.0000 mg | ORAL_CAPSULE | Freq: Every day | ORAL | 2 refills | Status: DC

## 2016-03-18 NOTE — Progress Notes (Signed)
Pre visit review using our clinic review tool, if applicable. No additional management support is needed unless otherwise documented below in the visit note. 

## 2016-03-18 NOTE — Patient Instructions (Signed)
A few things to remember from today's visit:   Other fatigue - Plan: Basic Metabolic Panel, VITAMIN D 25 Hydroxy (Vit-D Deficiency, Fractures), CBC, TSH  Arthralgia of both lower legs - Plan: TSH, DULoxetine (CYMBALTA) 30 MG capsule  Encounter for medication monitoring - Plan: Basic Metabolic Panel  Insomnia, unspecified  Low impact exercise. Good sleep hygiene, over-the-counter melatonin extended release 5 mg might help.  Cymbalta may help with joint pain and sleep as well as depressed mood.  Please let me know from my chart in about 3-4 weeks how you do with the Cymbalta and if you're having any side effect.  Because of the falls.  Caution with ibuprofen, can increase the risk of cardiovascular disease and kidney disease as well as elevation of blood pressure.   Please be sure medication list is accurate. If a new problem present, please set up appointment sooner than planned today.

## 2016-03-18 NOTE — Progress Notes (Signed)
HPI:   Hannah Jennings is a 65 y.o. female, who is here today to establish care with me.  Former PCP: Dr Amedeo Kinsman at [facility], [place] Last preventive routine visit: She follows with gyn periodically and had a routine physical with PCP about a year ago.  Concerns today: she would like thyroid check.  2-3 weeks of fatigue. 2 months of hair loss, no alopecia. She has not noted oral lesions or skin rash. No FHx for alopecia.  Hx of depression, has not taken medication before, she has been able to function without need of medications.  Lives with husband. Took care of her mother until she died late last year. Her son lived at home until a month ago, this also caused some stress.  + Insomnia, unchanged for years. She has difficulty mainly staying asleep, aggravated by leg pain if she does not take Ibuprofen.   Hx of GERD, well controlled with Pepcid 20 mg at bedtime. If she doe snot take medication she has heartburn.  Denies abdominal pain, nausea, vomiting, changes in bowel habits, blood in stool or melena.  Hx of osteoporosis, she was on Actonel until recently. According to pt, she had been on Actonel for over 5 years and has taken it intermittently for many years. Pending DEXA 04/2016, following with her gynecologists.  Medication was discontinued because hip/leg pain to see if it was caused by medication, it has not changed. She is reporting at least a year of intermittent , bilateral hip and thigh moderate achy pain, which is usually at night. Pain is exacerbated by prolonged sitting and alleviated by movement, it seems to be worse in the morning and it after a few steps.  Pain seems to be worse at night, sometimes it wakes her up, she takes ibuprofen 600 mg at bedtime and this seems to be helping with pain. + Hip stiffness. No edema or erythema.  She denies any numbness or tingling. She has not noted associated saddle anesthesia or urine/bowel incontinence. No  history of back pain.    Review of Systems  Constitutional: Positive for fatigue. Negative for activity change, appetite change, fever and unexpected weight change.  HENT: Negative for mouth sores, nosebleeds and trouble swallowing.   Eyes: Negative for redness and visual disturbance.  Respiratory: Negative for cough, shortness of breath and wheezing.   Cardiovascular: Negative for chest pain, palpitations and leg swelling.  Gastrointestinal: Negative for abdominal pain, nausea and vomiting.       Negative for changes in bowel habits.  Endocrine: Negative for cold intolerance and heat intolerance.  Genitourinary: Negative for decreased urine volume, difficulty urinating and hematuria.  Musculoskeletal: Positive for arthralgias and myalgias (LE). Negative for back pain, gait problem and neck pain.  Skin: Negative for color change and rash.  Neurological: Negative for tremors, syncope, weakness, numbness and headaches.  Hematological: Negative for adenopathy. Does not bruise/bleed easily.  Psychiatric/Behavioral: Positive for sleep disturbance. Negative for confusion. The patient is not nervous/anxious.       Current Outpatient Prescriptions on File Prior to Visit  Medication Sig Dispense Refill  . calcium carbonate (TUMS - DOSED IN MG ELEMENTAL CALCIUM) 500 MG chewable tablet Chew 1 tablet by mouth daily.     . cholecalciferol (VITAMIN D) 1000 UNITS tablet Take 1,000 Units by mouth daily.     Marland Kitchen conjugated estrogens (PREMARIN) vaginal cream Use 1/2 gram in vagina 2 - 3 times a week to control symptoms. 42.5 g 2  .  Docusate Calcium (STOOL SOFTENER PO) Take by mouth daily.    . famotidine (PEPCID) 20 MG tablet Take 20 mg by mouth daily.    Marland Kitchen ibuprofen (ADVIL,MOTRIN) 600 MG tablet Take 600 mg by mouth at bedtime.    Marland Kitchen neomycin-bacitracin-polymyxin (NEOSPORIN) ophthalmic ointment 1 application as needed.     No current facility-administered medications on file prior to visit.      Past  Medical History:  Diagnosis Date  . Oral lichen planus    --hx of oral--pt. states for years  . Osteoporosis 03-2012   T-score 1.5/2.8    No Known Allergies  Family History  Problem Relation Age of Onset  . Hypertension Mother   . Atrial fibrillation Mother   . Dementia Mother   . Hypertension Father   . Cancer Father     colon  . Neuropathy Sister   . Diabetes Maternal Grandmother   . Cancer Paternal Grandmother     Social History   Social History  . Marital status: Married    Spouse name: N/A  . Number of children: N/A  . Years of education: N/A   Social History Main Topics  . Smoking status: Former Smoker    Quit date: 01/26/1988  . Smokeless tobacco: Never Used  . Alcohol use 2.0 - 2.5 oz/week    4 - 5 Standard drinks or equivalent per week     Comment: 4-5 glasses of wine a week  . Drug use: No  . Sexual activity: Yes    Partners: Male    Birth control/ protection: Surgical     Comment: BTL   Other Topics Concern  . None   Social History Narrative  . None    Vitals:   03/18/16 1452  BP: 140/80  Pulse: 97  Resp: 12  Temp: 98.3 F (36.8 C)   O2 sat at RA 98%.  Body mass index is 23.57 kg/m.    Physical Exam  Nursing note and vitals reviewed. Constitutional: She is oriented to person, place, and time. She appears well-developed and well-nourished. No distress.  HENT:  Head: Atraumatic.  Mouth/Throat: Oropharynx is clear and moist and mucous membranes are normal.  Eyes: Conjunctivae and EOM are normal. Pupils are equal, round, and reactive to light.  Neck: No thyroid mass and no thyromegaly present.  Cardiovascular: Normal rate and regular rhythm.   No murmur heard. Pulses:      Dorsalis pedis pulses are 2+ on the right side, and 2+ on the left side.  Respiratory: Effort normal and breath sounds normal. No respiratory distress.  GI: Soft. She exhibits no mass. There is no hepatomegaly. There is no tenderness.  Musculoskeletal: She exhibits  no edema.  No pain upon palpation of paraspinal lumbar muscles. Hips ROM otherwise normal, no pain elicited.  Lymphadenopathy:    She has no cervical adenopathy.  Neurological: She is alert and oriented to person, place, and time. She has normal strength. No cranial nerve deficit. Coordination and gait normal.  Reflex Scores:      Patellar reflexes are 2+ on the right side and 2+ on the left side. SLR negative bilateral.  Skin: Skin is warm. No rash noted. No erythema.  Psychiatric: She has a normal mood and affect.  Well groomed, good eye contact.      ASSESSMENT AND PLAN:     [place] was seen today for new patient (initial visit).  Diagnoses and all orders for this visit:  Other fatigue  We discussed possible causes.  Certainly poor sleep and stress can contribute to problems. Further recommendations would be given according to lab results.   -     Basic Metabolic Panel -     VITAMIN D 25 Hydroxy (Vit-D Deficiency, Fractures) -     CBC -     TSH  Arthralgia of both lower legs  Hip arthralgias, radicular pain among some. Chronic. She is reporting work-up done and for now will hold on further imaging. May consider lumbar MRI. After discussion of side effects, she agrees with trying Cymbalta 30 mg daily.  -     TSH -     DULoxetine (CYMBALTA) 30 MG capsule; Take 1 capsule (30 mg total) by mouth daily.  Encounter for medication monitoring  We discussed adverse side effects of chronic NSAIDs use. We will continue monitoring renal function.  -     Basic Metabolic Panel  Insomnia, unspecified  Sleep hygiene. OTC Melatonin ER 5 mg may help and even Cymbalta could help.  Depression, major, recurrent, mild (HCC)  Cymbalta 30 mg daily started. Some side effects discussed. Clearly instructed about warning signs. She will let me know thorough my chart in about 3-4 weeks how she is doing with the medication. Follow-up in 6-8 weeks.  Need for immunization against  influenza -     Flu vaccine HIGH DOSE PF                Betty G. Martinique, MD  [facility] [place] office.

## 2016-03-19 LAB — BASIC METABOLIC PANEL
BUN: 16 mg/dL (ref 6–23)
CHLORIDE: 103 meq/L (ref 96–112)
CO2: 31 mEq/L (ref 19–32)
Calcium: 9.2 mg/dL (ref 8.4–10.5)
Creatinine, Ser: 0.77 mg/dL (ref 0.40–1.20)
GFR: 79.68 mL/min (ref >60.00)
GLUCOSE: 91 mg/dL (ref 70–99)
Potassium: 4.7 mEq/L (ref 3.5–5.1)
Sodium: 142 mEq/L (ref 135–145)

## 2016-03-19 LAB — CBC
HEMATOCRIT: 40 % (ref 36.0–46.0)
HEMOGLOBIN: 13.6 g/dL (ref 12.0–15.0)
MCHC: 33.9 g/dL (ref 30.0–36.0)
MCV: 87.8 fl (ref 78.0–100.0)
PLATELETS: 288 10*3/uL (ref 150.0–400.0)
RBC: 4.56 Mil/uL (ref 3.87–5.11)
RDW: 12.8 % (ref 11.5–15.5)
WBC: 6.3 10*3/uL (ref 4.0–10.5)

## 2016-03-19 LAB — TSH: TSH: 2.06 u[IU]/mL (ref 0.35–4.50)

## 2016-03-19 LAB — VITAMIN D 25 HYDROXY (VIT D DEFICIENCY, FRACTURES): VITD: 33.17 ng/mL (ref 30.00–100.00)

## 2016-03-20 ENCOUNTER — Encounter: Payer: Self-pay | Admitting: Family Medicine

## 2016-04-19 ENCOUNTER — Encounter: Payer: Self-pay | Admitting: Family Medicine

## 2016-04-22 ENCOUNTER — Other Ambulatory Visit: Payer: Self-pay | Admitting: Family Medicine

## 2016-04-22 MED ORDER — DULOXETINE HCL 60 MG PO CPEP: 60.0000 mg | ORAL_CAPSULE | Freq: Every day | ORAL | 0 refills | Status: DC

## 2016-04-25 ENCOUNTER — Other Ambulatory Visit: Payer: Self-pay | Admitting: Obstetrics and Gynecology

## 2016-04-25 DIAGNOSIS — Z1231 Encounter for screening mammogram for malignant neoplasm of breast: Secondary | ICD-10-CM

## 2016-06-04 ENCOUNTER — Ambulatory Visit
Admission: RE | Admit: 2016-06-04 | Discharge: 2016-06-04 | Disposition: A | Discharge status: Home / Self Care | Payer: Medicare Other | Source: Ambulatory Visit | Attending: Obstetrics and Gynecology | Admitting: Obstetrics and Gynecology

## 2016-06-04 DIAGNOSIS — Z1231 Encounter for screening mammogram for malignant neoplasm of breast: Secondary | ICD-10-CM

## 2016-06-04 DIAGNOSIS — M810 Age-related osteoporosis without current pathological fracture: Secondary | ICD-10-CM

## 2016-06-13 ENCOUNTER — Encounter: Payer: Self-pay | Admitting: Family Medicine

## 2016-06-13 ENCOUNTER — Ambulatory Visit (INDEPENDENT_AMBULATORY_CARE_PROVIDER_SITE_OTHER): Payer: Medicare Other | Admitting: Family Medicine

## 2016-06-13 VITALS — BP 126/76 | HR 88 | Resp 12 | Ht 64.25 in | Wt 138.4 lb

## 2016-06-13 DIAGNOSIS — M25562 Pain in left knee: Secondary | ICD-10-CM | POA: Diagnosis not present

## 2016-06-13 DIAGNOSIS — F330 Major depressive disorder, recurrent, mild: Secondary | ICD-10-CM | POA: Diagnosis not present

## 2016-06-13 DIAGNOSIS — H60543 Acute eczematoid otitis externa, bilateral: Secondary | ICD-10-CM

## 2016-06-13 DIAGNOSIS — M25561 Pain in right knee: Secondary | ICD-10-CM

## 2016-06-13 DIAGNOSIS — E785 Hyperlipidemia, unspecified: Secondary | ICD-10-CM | POA: Diagnosis not present

## 2016-06-13 LAB — LIPID PANEL
CHOLESTEROL: 216 mg/dL — AB (ref 0–200)
HDL: 70.1 mg/dL (ref >39.00)
LDL CALC: 127 mg/dL — AB (ref 0–99)
NonHDL: 145.71
Total CHOL/HDL Ratio: 3
Triglycerides: 95 mg/dL (ref 0.0–149.0)
VLDL: 19 mg/dL (ref 0.0–40.0)

## 2016-06-13 MED ORDER — DULOXETINE HCL 60 MG PO CPEP: 60.0000 mg | ORAL_CAPSULE | Freq: Every day | ORAL | 2 refills | Status: DC

## 2016-06-13 MED ORDER — DESONIDE 0.05 % EX OINT: 1.0000 "application " | TOPICAL_OINTMENT | Freq: Every day | CUTANEOUS | 1 refills | Status: DC | PRN

## 2016-06-13 NOTE — Patient Instructions (Signed)
A few things to remember from today's visit:   Arthralgia of both lower legs - Plan: DULoxetine (CYMBALTA) 60 MG capsule  Depression, major, recurrent, mild (HCC) - Plan: DULoxetine (CYMBALTA) 60 MG capsule  Dermatitis of ear canal, bilateral - Plan: desonide (DESOWEN) 0.05 % ointment  Hyperlipidemia, unspecified hyperlipidemia type - Plan: Lipid panel    Please be sure medication list is accurate. If a new problem present, please set up appointment sooner than planned today.

## 2016-06-13 NOTE — Progress Notes (Signed)
HPI:   [name] is a 65 y.o. female, who is here today to follow on depression, LE pain, and she has new concerns.  I saw her last 03/18/2016, when she was complaining about fatigue, difficulty sleeping due to lower extremity pain, and mild depression. She was started on Cymbalta 30 mg daily, increased to 60 mg a few weeks ago. She is tolerating medication well, she has not noted significant side effects except for dry mouth.  -Leg pain is "great", seldom now, mild achy. She has not needed ibuprofen, which she was taking frequently to manage pain.  -Sleeping better because pain is better controlled. She states that occasionally she has some difficulty staying asleep but it is not often. She feels rested next day.  -She denies depressed mood or suicidal thoughts.  Decreased "sad" episodes and crying spells.      Concerns today: ears itching, for years. Dry scaly skin in ear canal, bilateral.  She has used Rx hydrocortone with abx as needed for years and seems to help but recurrent.  She states that it seems to be "seasonal."  She has not noted headache or ear drainage. No Hx of eczema or scalp lesions.    HLD: She also would like her cholesterol check today. She follows a heathy and low fat diet.  Lab Results  Component Value Date   CHOL 202 (A) 03/20/2015   HDL 55 03/20/2015   LDLCALC 124 03/20/2015   TRIG 115 03/20/2015       Review of Systems  Constitutional: Negative for appetite change, fever and unexpected weight change.  HENT: Negative for ear discharge, ear pain, hearing loss, mouth sores, nosebleeds, sore throat and trouble swallowing.   Eyes: Negative for redness, itching and visual disturbance.  Respiratory: Negative for cough, shortness of breath and wheezing.   Cardiovascular: Negative for chest pain and palpitations.  Gastrointestinal: Negative for abdominal pain, nausea and vomiting.       Negative for changes in bowel habits.   Musculoskeletal: Negative for back pain, gait problem and joint swelling.  Neurological: Negative for weakness, numbness and headaches.  Psychiatric/Behavioral: Negative for confusion and suicidal ideas. The patient is not nervous/anxious.       Current Outpatient Prescriptions on File Prior to Visit  Medication Sig Dispense Refill  . calcium carbonate (TUMS - DOSED IN MG ELEMENTAL CALCIUM) 500 MG chewable tablet Chew 1 tablet by mouth daily.     . cholecalciferol (VITAMIN D) 1000 UNITS tablet Take 1,000 Units by mouth daily.     Marland Kitchen conjugated estrogens (PREMARIN) vaginal cream Use 1/2 gram in vagina 2 - 3 times a week to control symptoms. 42.5 g 2  . Docusate Calcium (STOOL SOFTENER PO) Take by mouth daily.    . famotidine (PEPCID) 20 MG tablet Take 20 mg by mouth daily.    Marland Kitchen ibuprofen (ADVIL,MOTRIN) 600 MG tablet Take 600 mg by mouth at bedtime.    Marland Kitchen neomycin-bacitracin-polymyxin (NEOSPORIN) ophthalmic ointment 1 application as needed.     No current facility-administered medications on file prior to visit.      Past Medical History:  Diagnosis Date  . Oral lichen planus    --hx of oral--pt. states for years  . Osteoporosis 03-2012   T-score 1.5/2.8    No Known Allergies  Social History   Social History  . Marital status: Married    Spouse name: N/A  . Number of children: N/A  . Years of education: N/A  Social History Main Topics  . Smoking status: Former Smoker    Quit date: 01/26/1988  . Smokeless tobacco: Never Used  . Alcohol use 2.0 - 2.5 oz/week    4 - 5 Standard drinks or equivalent per week     Comment: 4-5 glasses of wine a week  . Drug use: No  . Sexual activity: Yes    Partners: Male    Birth control/ protection: Surgical     Comment: BTL   Other Topics Concern  . None   Social History Narrative  . None    Vitals:   06/13/16 0933  BP: 126/76  Pulse: 88  Resp: 12   Body mass index is 23.58 kg/m.    Physical Exam  Nursing note and  vitals reviewed. Constitutional: She is oriented to person, place, and time. She appears well-developed and well-nourished. No distress.  HENT:  Head: Atraumatic.  Right Ear: Tympanic membrane normal. No tenderness.  Left Ear: Tympanic membrane normal. No tenderness.  Mouth/Throat: Oropharynx is clear and moist and mucous membranes are normal.  Scaly skin in ear canal, bilateral. No edema,erythema, or drainage.  Eyes: Conjunctivae and EOM are normal. Pupils are equal, round, and reactive to light.  Cardiovascular: Normal rate and regular rhythm.   No murmur heard. Respiratory: Effort normal and breath sounds normal. No respiratory distress.  Musculoskeletal:  Hips and knees movement otherwise normal, pain is not elicited. No tenderness upon palpation of lumbar paraspinal muscles bilaterally.  Lymphadenopathy:       Head (right side): No preauricular and no posterior auricular adenopathy present.       Head (left side): No preauricular and no posterior auricular adenopathy present.    She has no cervical adenopathy.  Neurological: She is alert and oriented to person, place, and time. She has normal strength. Gait normal.  Skin: Skin is warm. No rash noted. No erythema.  Psychiatric: She has a normal mood and affect.  Well groomed, good eye contact.      ASSESSMENT AND PLAN:     Hannah Jennings was seen today for follow-up.  Diagnoses and all orders for this visit:   Dermatitis of ear canal, bilateral  ? Eczema. Recommended topical steroid ointment once daily as needed, we discussed some side effects of chronic steroid use. Follow-up as needed.   -     desonide (DESOWEN) 0.05 % ointment; Apply 1 application topically daily as needed. For up to 2 weeks at the time.  Arthralgia of both lower legs  Greatly improving reported today. Continue Cymbalta 60 mg daily. Follow-up in 6 months. -     DULoxetine (CYMBALTA) 60 MG capsule; Take 1 capsule (60 mg total) by mouth  daily.  Depression, major, recurrent, mild (Hannah Jennings)  Improved. No changes in current management. Follow-up in 6 months. -     DULoxetine (CYMBALTA) 60 MG capsule; Take 1 capsule (60 mg total) by mouth daily.  Hyperlipidemia, unspecified hyperlipidemia type  Continue nonpharmacologic treatment. Further recommendations will be given according to lab results.  -     Lipid panel      -[name] was advised to return sooner than planned today if new concerns arise.       Vallorie Niccoli G. Martinique, MD  [facility]. [place] office.

## 2016-06-13 NOTE — Progress Notes (Signed)
Pre visit review using our clinic review tool, if applicable. No additional management support is needed unless otherwise documented below in the visit note. 

## 2016-06-15 ENCOUNTER — Encounter: Payer: Self-pay | Admitting: Family Medicine

## 2016-06-24 DIAGNOSIS — C801 Malignant (primary) neoplasm, unspecified: Secondary | ICD-10-CM

## 2016-06-24 HISTORY — DX: Malignant (primary) neoplasm, unspecified: C80.1

## 2016-12-01 ENCOUNTER — Encounter: Payer: Self-pay | Admitting: Family Medicine

## 2016-12-15 NOTE — Progress Notes (Signed)
HPI:   [name] is a 66 y.o. female, who is here today to follow on some chronic medical problems.  Last seen on 06/13/16.  Chronic pain: LE and hip bilateral for several years,achy, worse in the morning. Also mild knee achy pain, exacerbated by going up and down stairs. Pain is greatly improved after she started Cymbalta.  About 2-3 weeks ago she noted worsening of pain on lower extremities, worse at night. She just returned from vacation a few days ago, she reports prolonged walking and sleeping on a different mattress, she is not sure if any of those aggravated her pain.  Pain sometimes was waking her up. Pain has improved in the past week or so. Pain was 7/10 now 2-3/10.  She is not sure about exacerbating factors. Denies claudication-like symptoms, extremity edema, or erythema.   She has mild lower back pain, not radiated, and stable for years. She denies lower extremity numbness, tingling, burning, so anesthesia, or urine/bowel incontinence.  Legs pain is alleviated by rubbing,massaging muscles, and getting out of bed to walk. Moving her legs while she is in bed does not help.  On 03/18/16 she started Cymbalta.  Depression: Cymbalta 60 mg has helped. She denies suicidal thoughts. She is sleeping better, except when she has leg pain. Thoughts about the past that caused anxiety have resolved.    Review of Systems  Constitutional: Negative for activity change, appetite change, fatigue, fever and unexpected weight change.  Respiratory: Negative for shortness of breath and wheezing.   Cardiovascular: Negative for leg swelling.  Gastrointestinal: Negative for abdominal pain, nausea and vomiting.       Negative for changes in bowel habits or fecal incontinence.  Genitourinary: Negative for decreased urine volume, dysuria and hematuria.       Negative for urine incontinence.  Musculoskeletal: Positive for arthralgias, back pain and myalgias. Negative for  joint swelling.  Skin: Negative for rash and wound.  Neurological: Negative for syncope, weakness, numbness and headaches.  Hematological: Negative for adenopathy. Does not bruise/bleed easily.  Psychiatric/Behavioral: Negative for confusion and suicidal ideas. The patient is not nervous/anxious.       Current Outpatient Prescriptions on File Prior to Visit  Medication Sig Dispense Refill  . cholecalciferol (VITAMIN D) 1000 UNITS tablet Take 1,000 Units by mouth daily.     Marland Kitchen conjugated estrogens (PREMARIN) vaginal cream Use 1/2 gram in vagina 2 - 3 times a week to control symptoms. 42.5 g 2  . desonide (DESOWEN) 0.05 % ointment Apply 1 application topically daily as needed. For up to 2 weeks at the time. 15 g 1  . ibuprofen (ADVIL,MOTRIN) 600 MG tablet Take 600 mg by mouth at bedtime.     No current facility-administered medications on file prior to visit.      Past Medical History:  Diagnosis Date  . Oral lichen planus    --hx of oral--pt. states for years  . Osteoporosis 03-2012   T-score 1.5/2.8    No Known Allergies  Social History   Social History  . Marital status: Married    Spouse name: N/A  . Number of children: N/A  . Years of education: N/A   Social History Main Topics  . Smoking status: Former Smoker    Quit date: 01/26/1988  . Smokeless tobacco: Never Used  . Alcohol use 2.0 - 2.5 oz/week    4 - 5 Standard drinks or equivalent per week     Comment: 4-5 glasses of  wine a week  . Drug use: No  . Sexual activity: Yes    Partners: Male    Birth control/ protection: Surgical     Comment: BTL   Other Topics Concern  . None   Social History Narrative  . None    Vitals:   12/16/16 1028  BP: 132/84  Pulse: 90  Resp: 12   Body mass index is 24.36 kg/m.   Physical Exam  Nursing note and vitals reviewed. Constitutional: She is oriented to person, place, and time. She appears well-developed. She does not appear ill. No distress.  HENT:  Head:  Atraumatic.  Eyes: Conjunctivae and EOM are normal.  Cardiovascular:  Pulses:      Dorsalis pedis pulses are 2+ on the right side, and 2+ on the left side.  Mild varicose veins LE,bilateral.  Respiratory: Effort normal and breath sounds normal. No respiratory distress.  GI: Soft. She exhibits no mass. There is no hepatomegaly. There is no tenderness.  Musculoskeletal: She exhibits no edema.       Thoracic back: She exhibits no tenderness and no bony tenderness.       Lumbar back: She exhibits no tenderness and no bony tenderness.  No significant deformity appreciated. No tenderness upon palpation of paraspinal muscles. Pain is not elicited with movement on exam table during examination. Hips with adequate ROM, no pain elicited. Knee: Crepitus bilateral, no effusion, no pain upon ROM.  Neurological: She is alert and oriented to person, place, and time. She has normal strength. Gait normal.  Reflex Scores:      Patellar reflexes are 2+ on the right side and 2+ on the left side. SLR negative bilateral.  Skin: Skin is warm. No rash noted. No erythema.  Psychiatric: She has a normal mood and affect.  Well groomed, good eye contact.      ASSESSMENT AND PLAN:   Hannah Jennings was seen today for follow-up.  Diagnoses and all orders for this visit:  Bilateral leg pain  We discussed possible etiologies: vascular, RLS,musculoskeletal,and radicular among some. Pain is not the typical for neuropathic/radicular pain but this needs to be considered as differential Dx. Given the fact it is at night and keeping her from sleep sometimes,I think lumbar MRI is appropriate. Will consider Gabapentin if pain gets worse again.  -     MR Lumbar Spine Wo Contrast; Future  Arthralgia of both lower legs  Pain is well controlled with Cymbalta. No changes in current management.  -     DULoxetine (CYMBALTA) 60 MG capsule; Take 1 capsule (60 mg total) by mouth daily.  Chronic bilateral low back pain, with  sciatica presence unspecified  Stable for years. ? With radicular pain (leg pain). Further recommendations would be given according to imaging results. Instructed about warning signs.  -     MR Lumbar Spine Wo Contrast; Future  Depression, major, recurrent, mild ([place])  Well controlled. No changes in current management.  -     DULoxetine (CYMBALTA) 60 MG capsule; Take 1 capsule (60 mg total) by mouth daily.     I will see her back in 6 months for her routine physical. If chronic problems are stable, I think we could consider annual follow-ups after next OV.    -[name] was advised to return sooner than planned today if new concerns arise.       Donasia Wimes G. Martinique, MD  [facility]. [place] office.

## 2016-12-16 ENCOUNTER — Encounter: Payer: Self-pay | Admitting: Family Medicine

## 2016-12-16 ENCOUNTER — Ambulatory Visit (INDEPENDENT_AMBULATORY_CARE_PROVIDER_SITE_OTHER): Payer: Medicare Other | Admitting: Family Medicine

## 2016-12-16 VITALS — BP 132/84 | HR 90 | Resp 12 | Ht 64.25 in | Wt 143.0 lb

## 2016-12-16 DIAGNOSIS — M79604 Pain in right leg: Secondary | ICD-10-CM

## 2016-12-16 DIAGNOSIS — M25561 Pain in right knee: Secondary | ICD-10-CM | POA: Diagnosis not present

## 2016-12-16 DIAGNOSIS — M25562 Pain in left knee: Secondary | ICD-10-CM

## 2016-12-16 DIAGNOSIS — M545 Low back pain: Secondary | ICD-10-CM | POA: Diagnosis not present

## 2016-12-16 DIAGNOSIS — M79605 Pain in left leg: Secondary | ICD-10-CM

## 2016-12-16 DIAGNOSIS — F330 Major depressive disorder, recurrent, mild: Secondary | ICD-10-CM | POA: Diagnosis not present

## 2016-12-16 DIAGNOSIS — G8929 Other chronic pain: Secondary | ICD-10-CM

## 2016-12-16 MED ORDER — DULOXETINE HCL 60 MG PO CPEP: 60.0000 mg | ORAL_CAPSULE | Freq: Every day | ORAL | 2 refills | Status: DC

## 2016-12-16 NOTE — Patient Instructions (Addendum)
A few things to remember from today's visit:   Insomnia, unspecified type  Arthralgia of both lower legs  Depression, major, recurrent, mild (HCC)  Bilateral leg pain - Plan: MR Lumbar Spine Wo Contrast  Chronic bilateral low back pain, with sciatica presence unspecified - Plan: MR Lumbar Spine Wo Contrast   Please be sure medication list is accurate. If a new problem present, please set up appointment sooner than planned today.

## 2016-12-26 ENCOUNTER — Ambulatory Visit
Admission: RE | Admit: 2016-12-26 | Discharge: 2016-12-26 | Disposition: A | Discharge status: Home / Self Care | Payer: Medicare Other | Source: Ambulatory Visit | Attending: Family Medicine | Admitting: Family Medicine

## 2016-12-26 DIAGNOSIS — M79604 Pain in right leg: Secondary | ICD-10-CM

## 2016-12-26 DIAGNOSIS — G8929 Other chronic pain: Secondary | ICD-10-CM

## 2016-12-26 DIAGNOSIS — M545 Low back pain: Secondary | ICD-10-CM

## 2016-12-26 DIAGNOSIS — M79605 Pain in left leg: Principal | ICD-10-CM

## 2016-12-30 ENCOUNTER — Encounter: Payer: Self-pay | Admitting: Family Medicine

## 2017-02-26 ENCOUNTER — Ambulatory Visit: Payer: Medicare Other | Admitting: Obstetrics and Gynecology

## 2017-03-13 ENCOUNTER — Encounter: Payer: Self-pay | Admitting: Family Medicine

## 2017-03-19 ENCOUNTER — Other Ambulatory Visit (HOSPITAL_COMMUNITY)
Admission: RE | Admit: 2017-03-19 | Discharge: 2017-03-19 | Disposition: A | Discharge status: Home / Self Care | Payer: Medicare Other | Source: Ambulatory Visit | Attending: Obstetrics and Gynecology | Admitting: Obstetrics and Gynecology

## 2017-03-19 ENCOUNTER — Encounter: Payer: Self-pay | Admitting: Obstetrics and Gynecology

## 2017-03-19 ENCOUNTER — Ambulatory Visit (INDEPENDENT_AMBULATORY_CARE_PROVIDER_SITE_OTHER): Payer: Medicare Other | Admitting: Obstetrics and Gynecology

## 2017-03-19 VITALS — BP 130/62 | HR 84 | Resp 16 | Ht 64.0 in | Wt 142.4 lb

## 2017-03-19 DIAGNOSIS — Z01419 Encounter for gynecological examination (general) (routine) without abnormal findings: Secondary | ICD-10-CM | POA: Insufficient documentation

## 2017-03-19 MED ORDER — ESTROGENS, CONJUGATED 0.625 MG/GM VA CREA: TOPICAL_CREAM | VAGINAL | 1 refills | Status: DC

## 2017-03-19 NOTE — Progress Notes (Signed)
66 y.o. [id number] [name] here for annual exam.    No vaginal bleeding.   Dx of basal cell carcinoma. Goes to Dr. Allyn Kenner, [place].  Increased appetite and weight gain.  Walks 1 - 2 miles per day with dog.   Leg pain with Actonel.  Stopped this.  Doing well with Cymbalta to treat her leg pain.   Labs with PCP.   PCP:  Betty Martinique, MD    Patient's last menstrual period was 06/24/2002 (approximate).           Sexually active: Yes.   female The current method of family planning is tubal ligation.    Exercising: Yes.    Walks her dogs--2 miles/day Smoker:  no  Health Maintenance: Pap: 02-01-15 Neg, 01-25-13 Neg:Neg HR HPV History of abnormal Pap:  no MMG: 06-04-16 Density C/Neg/BiRads1:TBC Colonoscopy: 2015 normal with Dr. Buccini;next due 2019/2020 due to family history of colon cancer in father.  BMD: 06-04-16  Result Osteopenia of hip and spine:TBC TDaP: 11/2007  Gardasil:   no VEH:MCNOB Hep C:never Screening Labs:  Hb today: PCP, Urine today: not done   reports that she quit smoking about 29 years ago. She has never used smokeless tobacco. She reports that she drinks about 1.8 oz of alcohol per week . She reports that she does not use drugs.  Past Medical History:  Diagnosis Date  . Cancer ([place]) 2018   basal cell--chest and left ear  . Oral lichen planus    --hx of oral--pt. states for years  . Osteoporosis 03-2012   T-score 1.5/2.8     Past Surgical History:  Procedure Laterality Date  . RETINAL TEAR REPAIR CRYOTHERAPY Right   . salivatory gland  1990   Blocked salivatory gland removed  . TONSILLECTOMY AND ADENOIDECTOMY  Age 5  . TUBAL LIGATION  1980    Current Outpatient Prescriptions  Medication Sig Dispense Refill  . CALCIUM CITRATE PO Take 1 tablet by mouth daily.    . cholecalciferol (VITAMIN D) 1000 UNITS tablet Take 1,000 Units by mouth daily.     Marland Kitchen conjugated estrogens (PREMARIN) vaginal cream Use 1/2 gram in vagina 2 - 3 times a week to  control symptoms. 42.5 g 2  . desonide (DESOWEN) 0.05 % ointment Apply 1 application topically daily as needed. For up to 2 weeks at the time. 15 g 1  . DULoxetine (CYMBALTA) 60 MG capsule Take 1 capsule (60 mg total) by mouth daily. 90 capsule 2  . esomeprazole (NEXIUM) 40 MG capsule Take 40 mg by mouth daily at 12 noon.    Marland Kitchen ibuprofen (ADVIL,MOTRIN) 600 MG tablet Take 600 mg by mouth at bedtime.     No current facility-administered medications for this visit.     Family History  Problem Relation Age of Onset  . Hypertension Mother   . Atrial fibrillation Mother   . Dementia Mother   . Hypertension Father   . Cancer Father        colon  . Neuropathy Sister   . Diabetes Maternal Grandmother   . Cancer Paternal Grandmother     ROS:  Pertinent items are noted in HPI.  Otherwise, a comprehensive ROS was negative.  Exam:   BP 130/62 (BP Location: Right Arm, Patient Position: Sitting, Cuff Size: Normal)   Pulse 84   Resp 16   Ht 5\' 4"  (1.626 m)   Wt 142 lb 6.4 oz (64.6 kg)   LMP 06/24/2002 (Approximate)   BMI 24.44 kg/m  General appearance: alert, cooperative and appears stated age Head: Normocephalic, without obvious abnormality, atraumatic Neck: no adenopathy, supple, symmetrical, trachea midline and thyroid normal to inspection and palpation Lungs: clear to auscultation bilaterally Breasts: normal appearance, no masses or tenderness, No nipple retraction or dimpling, No nipple discharge or bleeding, No axillary or supraclavicular adenopathy Heart: regular rate and rhythm Abdomen: soft, non-tender; no masses, no organomegaly Extremities: extremities normal, atraumatic, no cyanosis or edema Skin: Skin color, texture, turgor normal. No rashes or lesions Lymph nodes: Cervical, supraclavicular, and axillary nodes normal. No abnormal inguinal nodes palpated Neurologic: Grossly normal  Pelvic: External genitalia:  no lesions              Urethra:  normal appearing urethra  with no masses, tenderness or lesions              Bartholins and Skenes: normal                 Vagina: normal appearing vagina with normal color and discharge, no lesions              Cervix: no lesions              Pap taken: Yes.   Bimanual Exam:  Uterus:  normal size, contour, position, consistency, mobility, non-tender              Adnexa: no mass, fullness, tenderness              Rectal exam: Yes.  .  Confirms.              Anus:  normal sphincter tone, no lesions  Chaperone was present for exam.  Assessment:   Well woman visit with normal exam. Osteopenia. Hx vit D deficiency.  Atrophy of vagina.  Plan: Mammogram screening discussed. Recommended self breast awareness. Pap and HR HPV as above. Guidelines for Calcium, Vitamin D, regular exercise program including cardiovascular and weight bearing exercise. Refill of Premarin cream 1/2 gram 2 -3 times per week.  Discussed potential increased risk of breast cancer.  Labs with PCP.  BMD end of 2019. Follow up annually and prn.   After visit summary provided.

## 2017-03-19 NOTE — Patient Instructions (Signed)

## 2017-03-21 LAB — CYTOLOGY - PAP: Diagnosis: NEGATIVE

## 2017-06-24 NOTE — Progress Notes (Signed)
HPI:   Hannah Jennings is a 67 y.o. female, who is here today for her routine physical.  Last CPE: 2016. She recently saw her gyn , Dr Judeth Horn.  Regular exercise 3 or more time per week: Yes, she walks her dogs daily 2 miles. Following a healthy diet: Yes. She lives with husband, who is also independent.   Independent ADL's and IADL's. No falls in the past year.  Functional Status Survey: Is the patient deaf or have difficulty hearing?: No Does the patient have difficulty seeing, even when wearing glasses/contacts?: No Does the patient have difficulty concentrating, remembering, or making decisions?: No Does the patient have difficulty walking or climbing stairs?: No Does the patient have difficulty dressing or bathing?: No Does the patient have difficulty doing errands alone such as visiting a doctor's office or shopping?: No  Fall Risk  06/25/2017  Falls in the past year? No     Providers she sees regularly:   Dr Delman Cheadle is her new ophthalmologist.She has appt next week. Dr Nevada Crane, dermatologist. She follows q 6 months , Hx of BCC. Dr Judeth Horn (gyn), annually.   Depression screen PHQ 2/9 06/25/2017  Decreased Interest 0  Down, Depressed, Hopeless 0  PHQ - 2 Score 0    Mini-Cog - 06/25/17 0258    How many words correct?  3         Hearing Screening   125Hz  250Hz  500Hz  1000Hz  2000Hz  3000Hz  4000Hz  6000Hz  8000Hz   Right ear:   Pass Pass Pass  Pass    Left ear:   Pass Pass Pass  Pass      Visual Acuity Screening   Right eye Left eye Both eyes  Without correction: 20/20 20/100 20/20  With correction:       Chronic medical problems: HLD, GERD (on Nexium), tinnitus, depression,insomnia,osteoporosis (She was on Fosamax and Actonel), PAC's on Holter in the past,and arthralgias among some. She is on Cymbalta 60 mg for depression,anxiety and leg pain/OA. She denies depressed mood or suicidal thoughts.  Immunization History  Administered Date(s)  Administered  . Influenza, High Dose Seasonal PF 03/18/2016  . Pneumococcal Polysaccharide-23 06/25/2017  . Tdap 12/08/2007    Mammogram: 06/04/16 Birads 1 Colonoscopy: 03/2016 5 years, F/U recommended. DEXA: Done through gyn,osteopenia. She is on Ca++ and Vit D supplementation.  Last eye exam: within a year ago, she has appt arranged.  Hep C screening: Never, denies risk factors and would like to have it done.   Hyperlipidemia:  Currently on non pharmacologic treatment. Following a low fat diet: Yes.  Lab Results  Component Value Date   CHOL 216 (H) 06/13/2016   HDL 70.10 06/13/2016   LDLCALC 127 (H) 06/13/2016   TRIG 95.0 06/13/2016   CHOLHDL 3 06/13/2016     Review of Systems  Constitutional: Negative for appetite change, fatigue, fever and unexpected weight change.  HENT: Positive for tinnitus. Negative for dental problem, hearing loss, mouth sores, sore throat, trouble swallowing and voice change.   Eyes: Negative for redness and visual disturbance.  Respiratory: Negative for cough, shortness of breath and wheezing.   Cardiovascular: Negative for chest pain and leg swelling.  Gastrointestinal: Negative for abdominal pain, nausea and vomiting.       No changes in bowel habits.  Endocrine: Negative for cold intolerance, heat intolerance, polydipsia, polyphagia and polyuria.  Genitourinary: Negative for decreased urine volume, dysuria, hematuria, vaginal bleeding and vaginal discharge.  Musculoskeletal: Positive for arthralgias. Negative for gait problem and  neck pain.  Skin: Negative for color change and rash.  Allergic/Immunologic: Negative for environmental allergies.  Neurological: Negative for seizures, syncope, weakness, numbness and headaches.  Hematological: Negative for adenopathy. Does not bruise/bleed easily.  Psychiatric/Behavioral: Negative for confusion, sleep disturbance and suicidal ideas. The patient is nervous/anxious.   All other systems reviewed  and are negative.     Current Outpatient Medications on File Prior to Visit  Medication Sig Dispense Refill  . CALCIUM CITRATE PO Take 1 tablet by mouth daily.    . cholecalciferol (VITAMIN D) 1000 UNITS tablet Take 1,000 Units by mouth daily.     Marland Kitchen conjugated estrogens (PREMARIN) vaginal cream Use 1/2 gram in vagina 2 - 3 times a week to control symptoms. 42.5 g 1  . desonide (DESOWEN) 0.05 % ointment Apply 1 application topically daily as needed. For up to 2 weeks at the time. 15 g 1  . Docusate Calcium (STOOL SOFTENER PO) Take by mouth daily.    Marland Kitchen esomeprazole (NEXIUM) 40 MG capsule Take 40 mg by mouth daily at 12 noon.    Marland Kitchen ibuprofen (ADVIL,MOTRIN) 600 MG tablet Take 600 mg by mouth as needed.      No current facility-administered medications on file prior to visit.      Past Medical History:  Diagnosis Date  . Cancer ([place]) 2018   basal cell--chest and left ear  . Oral lichen planus    --hx of oral--pt. states for years  . Osteoporosis 03-2012   T-score 1.5/2.8     Past Surgical History:  Procedure Laterality Date  . RETINAL TEAR REPAIR CRYOTHERAPY Right   . salivatory gland  1990   Blocked salivatory gland removed  . TONSILLECTOMY AND ADENOIDECTOMY  Age 74  . TUBAL LIGATION  1980    No Known Allergies  Family History  Problem Relation Age of Onset  . Hypertension Mother   . Atrial fibrillation Mother   . Dementia Mother   . Hypertension Father   . Cancer Father        colon  . Neuropathy Sister   . Diabetes Maternal Grandmother   . Cancer Paternal Grandmother     Social History   Socioeconomic History  . Marital status: Married    Spouse name: None  . Number of children: None  . Years of education: None  . Highest education level: None  Social Needs  . Financial resource strain: None  . Food insecurity - worry: None  . Food insecurity - inability: None  . Transportation needs - medical: None  . Transportation needs - non-medical: None    Occupational History  . None  Tobacco Use  . Smoking status: Former Smoker    Last attempt to quit: 01/26/1988    Years since quitting: 29.4  . Smokeless tobacco: Never Used  Substance and Sexual Activity  . Alcohol use: Yes    Alcohol/week: 1.8 oz    Types: 3 Glasses of wine per week  . Drug use: No  . Sexual activity: Yes    Partners: Male    Birth control/protection: Surgical    Comment: BTL  Other Topics Concern  . None  Social History Narrative  . None     Vitals:   06/25/17 0750  BP: 124/82  Pulse: 82  Resp: 12  Temp: 98.6 F (37 C)  SpO2: 98%   Body mass index is 24.63 kg/m.   Wt Readings from Last 3 Encounters:  06/25/17 143 lb 6 oz (65 kg)  03/19/17 142 lb 6.4 oz (64.6 kg)  12/16/16 143 lb (64.9 kg)    Physical Exam  Nursing note and vitals reviewed. Constitutional: She is oriented to person, place, and time. She appears well-developed and well-nourished. No distress.  HENT:  Head: Normocephalic and atraumatic.  Right Ear: Hearing, tympanic membrane, external ear and ear canal normal.  Left Ear: Hearing, tympanic membrane, external ear and ear canal normal.  Mouth/Throat: Uvula is midline, oropharynx is clear and moist and mucous membranes are normal.  Eyes: Conjunctivae and EOM are normal. Pupils are equal, round, and reactive to light.  Neck: No tracheal deviation present. No thyromegaly present.  Cardiovascular: Normal rate and regular rhythm.  No murmur heard. Pulses:      Radial pulses are 2+ on the right side, and 2+ on the left side.       Dorsalis pedis pulses are 2+ on the right side, and 2+ on the left side.  Respiratory: Effort normal and breath sounds normal. No respiratory distress.  GI: Soft. She exhibits no mass. There is no hepatomegaly. There is no tenderness.  Musculoskeletal: She exhibits no edema.  No major deformity or signs of synovitis appreciated.  Lymphadenopathy:    She has no cervical adenopathy.       Right: No  supraclavicular adenopathy present.       Left: No supraclavicular adenopathy present.  Neurological: She is alert and oriented to person, place, and time. She has normal strength. No cranial nerve deficit or sensory deficit. Coordination and gait normal.  Reflex Scores:      Bicep reflexes are 2+ on the right side and 2+ on the left side.      Patellar reflexes are 2+ on the right side and 2+ on the left side. Skin: Skin is warm. No rash noted. No erythema.  Psychiatric: She has a normal mood and affect. Her speech is normal.  Well groomed, good eye contact.     ASSESSMENT AND PLAN:   Ms. Yemaya was seen today for annual exam.  Diagnoses and all orders for this visit:  Lab Results  Component Value Date   CHOL 223 (H) 06/25/2017   HDL 56.90 06/25/2017   LDLCALC 130 (H) 06/25/2017   TRIG 180.0 (H) 06/25/2017   CHOLHDL 4 06/25/2017   Lab Results  Component Value Date   CREATININE 0.72 06/25/2017   BUN 17 06/25/2017   NA 140 06/25/2017   K 4.4 06/25/2017   CL 100 06/25/2017   CO2 31 06/25/2017   Lab Results  Component Value Date   ALT 22 06/25/2017   AST 20 06/25/2017   ALKPHOS 68 06/25/2017   BILITOT 0.5 06/25/2017    Medicare annual wellness visit, subsequent  We discussed the importance of staying active, physically and mentally, as well as the benefits of a healthy/balnace diet. Low impact exercise that involve stretching and strengthing are ideal.  We discussed preventive screening for the next 5-10 years, summery of recommendations given in AVS:  Colon cancer: Colonoscopy due in 2022. Eye exam and glaucoma every 1-2 years. Diabetes screening q 1-3 years. Mammogram q 1-2 years. DEXA in 2019. Fall prevention.  Advance directives and end of life discussed, she has POA and living will.  Routine general medical examination at a health care facility  Preventive guidelines reviewed. She will continue following with her gyn for her routine female preventive  care. Vaccination updated.  Ca++ and vit D supplementation to continue. Next CPE in a year.  The 10-year  ASCVD risk score Mikey Bussing DC Jr., et al., 2013) is: 6.7%   Values used to calculate the score:     Age: 75 years     Sex: Female     Is Non-Hispanic African American: No     Diabetic: No     Tobacco smoker: No     Systolic Blood Pressure: 500 mmHg     Is BP treated: No     HDL Cholesterol: 56.9 mg/dL     Total Cholesterol: 223 mg/dL  Hyperlipidemia, unspecified hyperlipidemia type  No changes in non pharmacologic management, will follow labs done today and will give further recommendations accordingly.  -     Comprehensive metabolic panel -     Lipid panel  Depression, major, recurrent, mild (HCC)  Well controlled with Cymbalta,no changes. F/U in 6 months.  -     DULoxetine (CYMBALTA) 60 MG capsule; Take 1 capsule (60 mg total) by mouth daily.  Arthralgia of both lower legs  Improved with Cymbalta. No changes in current management. F/U in 6 months.  -     DULoxetine (CYMBALTA) 60 MG capsule; Take 1 capsule (60 mg total) by mouth daily.  Encounter for hepatitis C screening test for low risk patient -     Hepatitis C antibody screen  Need for 23-polyvalent pneumococcal polysaccharide vaccine -     Pneumococcal polysaccharide vaccine 23-valent greater than or equal to 2yo subcutaneous/IM  Other orders -     Zoster Vaccine Adjuvanted [facility]) injection; 0.5 ml in muscle and repeat in 8 weeks   Return in about 6 months (around 12/23/2017) for Cymbalta.       Betty G. Martinique, MD  [facility]. [place] office.

## 2017-06-25 ENCOUNTER — Encounter: Payer: Self-pay | Admitting: Family Medicine

## 2017-06-25 ENCOUNTER — Ambulatory Visit: Payer: Self-pay | Admitting: *Deleted

## 2017-06-25 ENCOUNTER — Ambulatory Visit (INDEPENDENT_AMBULATORY_CARE_PROVIDER_SITE_OTHER): Payer: Medicare Other | Admitting: Family Medicine

## 2017-06-25 VITALS — BP 124/82 | HR 82 | Temp 98.6°F | Resp 12 | Ht 63.98 in | Wt 143.4 lb

## 2017-06-25 DIAGNOSIS — Z1159 Encounter for screening for other viral diseases: Secondary | ICD-10-CM

## 2017-06-25 DIAGNOSIS — M25561 Pain in right knee: Secondary | ICD-10-CM | POA: Diagnosis not present

## 2017-06-25 DIAGNOSIS — E785 Hyperlipidemia, unspecified: Secondary | ICD-10-CM | POA: Diagnosis not present

## 2017-06-25 DIAGNOSIS — Z23 Encounter for immunization: Secondary | ICD-10-CM | POA: Diagnosis not present

## 2017-06-25 DIAGNOSIS — F330 Major depressive disorder, recurrent, mild: Secondary | ICD-10-CM

## 2017-06-25 DIAGNOSIS — M25562 Pain in left knee: Secondary | ICD-10-CM

## 2017-06-25 DIAGNOSIS — Z0000 Encounter for general adult medical examination without abnormal findings: Secondary | ICD-10-CM

## 2017-06-25 LAB — COMPREHENSIVE METABOLIC PANEL
ALBUMIN: 4.3 g/dL (ref 3.5–5.2)
ALT: 22 U/L (ref 0–35)
AST: 20 U/L (ref 0–37)
Alkaline Phosphatase: 68 U/L (ref 39–117)
BILIRUBIN TOTAL: 0.5 mg/dL (ref 0.2–1.2)
BUN: 17 mg/dL (ref 6–23)
CHLORIDE: 100 meq/L (ref 96–112)
CO2: 31 mEq/L (ref 19–32)
CREATININE: 0.72 mg/dL (ref 0.40–1.20)
Calcium: 8.9 mg/dL (ref 8.4–10.5)
GFR: 85.77 mL/min (ref >60.00)
Glucose, Bld: 98 mg/dL (ref 70–99)
Potassium: 4.4 mEq/L (ref 3.5–5.1)
Sodium: 140 mEq/L (ref 135–145)
Total Protein: 7 g/dL (ref 6.0–8.3)

## 2017-06-25 LAB — LIPID PANEL
CHOL/HDL RATIO: 4
Cholesterol: 223 mg/dL — ABNORMAL HIGH (ref 0–200)
HDL: 56.9 mg/dL (ref >39.00)
LDL Cholesterol: 130 mg/dL — ABNORMAL HIGH (ref 0–99)
NONHDL: 166.05
Triglycerides: 180 mg/dL — ABNORMAL HIGH (ref 0.0–149.0)
VLDL: 36 mg/dL (ref 0.0–40.0)

## 2017-06-25 MED ORDER — ZOSTER VAC RECOMB ADJUVANTED 50 MCG/0.5ML IM SUSR: INTRAMUSCULAR | 1 refills | Status: DC

## 2017-06-25 NOTE — Patient Instructions (Addendum)
A few things to remember from today's visit:   Hyperlipidemia, unspecified hyperlipidemia type - Plan: Comprehensive metabolic panel, Lipid panel  Depression, major, recurrent, mild (HCC)  Arthralgia of both lower legs  Encounter for hepatitis C screening test for low risk patient - Plan: Hepatitis C antibody screen  Routine general medical examination at a health care facility   A few tips:  -As we age balance is not as good as it was, so there is a higher risks for falls. Please remove small rugs and furniture that is "in your way" and could increase the risk of falls. Stretching exercises may help with fall prevention: Yoga and Tai Chi are some examples. Low impact exercise is better, so you are not very achy the next day.  -Sun screen and avoidance of direct sun light recommended. Caution with dehydration, if working outdoors be sure to drink enough fluids.  - Some medications are not safe as we age, increases the risk of side effects and can potentially interact with other medication you are also taken;  including some of over the counter medications. Be sure to let me know when you start a new medication even if it is a dietary/vitamin supplement.   -Healthy diet low in red meet/animal fat and sugar + regular physical activity is recommended.       Screening schedule for the next 5-10 years:  Colonoscopy due in 2022  Glaucoma screening/eye exam every 1-2 years.  Mammogram for breast cancer screening annually.  Flu vaccine annually.  Diabetes and cholesterol screening   Fall prevention  Bone density in 2-3 years. Pneumovax today. Tdap in 11/2017.   Advance directives:  Please see a lawyer and/or go to this website to help you with advanced directives and designating a health care power of attorney so that your wishes will be followed should you become too ill to make your own medical decisions.  [email or web address]       Please be sure  medication list is accurate. If a new problem present, please set up appointment sooner than planned today.

## 2017-06-25 NOTE — Telephone Encounter (Signed)
Message routed to Dr. Jordan for review 

## 2017-06-25 NOTE — Telephone Encounter (Signed)
Patient states she had her second pneumonia shot today and she wants to let the office know that she has had a significant reaction at the site. She state she has itching and swelling in the arm. She states there is some redness. She is going to treat with Benadryl, Tylenol, and ice. She will contact the office if her symptoms get worse and do not improve over night. She wants to make sure that her reaction is noted in her chart. Reason for Disposition . Injection site reaction to any vaccine  Answer Assessment - Initial Assessment Questions 1. SYMPTOMS: "What is the main symptom?" (e.g., redness, swelling, pain)      Swelling, itching and redness 2. ONSET: "When was the vaccine (shot) given?" "How much later did the __________ begin?" (e.g., hours, days ago)      8:30am-- itching started 2-1   Swelling started 3:00 3. SEVERITY: "How bad is it?"      1/3 of normal size- looks tight 4. FEVER: "Is there a fever?" If so, ask: "What is it, how was it measured, and when did it start?"      no 5. IMMUNIZATIONS GIVEN: "What shots have you recently received?"     Second pneumonia  6. PAST REACTIONS: "Have you reacted to immunizations before?" If so, ask: "What happened?"     Local at injection site- but nothing like this 7. OTHER SYMPTOMS: "Do you have any other symptoms?"     no  Protocols used: IMMUNIZATION REACTIONS-A-AH

## 2017-06-25 NOTE — Telephone Encounter (Signed)
FYI only.

## 2017-06-26 ENCOUNTER — Encounter: Payer: Self-pay | Admitting: Family Medicine

## 2017-06-26 LAB — HEPATITIS C ANTIBODY
Hepatitis C Ab: NONREACTIVE
SIGNAL TO CUT-OFF: 0.01 (ref <1.00)

## 2017-06-26 MED ORDER — DULOXETINE HCL 60 MG PO CPEP: 60.0000 mg | ORAL_CAPSULE | Freq: Every day | ORAL | 2 refills | Status: DC

## 2017-06-26 NOTE — Telephone Encounter (Signed)
Sometimes local reactions to vaccines happen.  Monitor for now, it should be better in a couple days. Thanks, BJ

## 2017-11-13 ENCOUNTER — Other Ambulatory Visit: Payer: Self-pay | Admitting: Podiatry

## 2017-11-13 ENCOUNTER — Ambulatory Visit (INDEPENDENT_AMBULATORY_CARE_PROVIDER_SITE_OTHER): Payer: Medicare Other

## 2017-11-13 ENCOUNTER — Ambulatory Visit (INDEPENDENT_AMBULATORY_CARE_PROVIDER_SITE_OTHER): Payer: Medicare Other | Admitting: Podiatry

## 2017-11-13 ENCOUNTER — Encounter: Payer: Self-pay | Admitting: Podiatry

## 2017-11-13 VITALS — BP 137/91 | HR 99

## 2017-11-13 DIAGNOSIS — M79671 Pain in right foot: Secondary | ICD-10-CM

## 2017-11-13 DIAGNOSIS — M778 Other enthesopathies, not elsewhere classified: Secondary | ICD-10-CM

## 2017-11-13 DIAGNOSIS — M779 Enthesopathy, unspecified: Secondary | ICD-10-CM

## 2017-11-13 DIAGNOSIS — M7751 Other enthesopathy of right foot: Secondary | ICD-10-CM | POA: Diagnosis not present

## 2017-11-13 MED ORDER — TRIAMCINOLONE ACETONIDE 10 MG/ML IJ SUSP
10.0000 mg | Freq: Once | INTRAMUSCULAR | Status: AC
  Administered 2017-11-13: 10 mg

## 2017-11-17 NOTE — Progress Notes (Signed)
Subjective:   Patient ID: Hannah Jennings, female   DOB: 67 y.o.   MRN: [id number]   HPI Patient presents stating I had quite a bit of discomfort in the plantar aspect of my right foot around the capsule and states that it is been hurting for the last several months.  Patient states that she does not remember specific injury or other bone pathology   Review of Systems  All other systems reviewed and are negative.       Objective:  Physical Exam  Constitutional: She appears well-developed and well-nourished.  Cardiovascular: Intact distal pulses.  Pulmonary/Chest: Effort normal.  Musculoskeletal: Normal range of motion.  Neurological: She is alert.  Skin: Skin is warm.  Nursing note and vitals reviewed.   Neurovascular status intact muscle strength is adequate range of motion within normal limits with patient noted to have inflammation and fluid around the second MPJ right that is localized in nature and is associated with mild swelling.  There is no proximal edema erythema or drainage noted currently and patient has good digital perfusion and is well oriented x3     Assessment:  Probability for capsulitis of an acute nature second MPJ with possibility for systemic manifestations or stress fracture     Plan:  H&P x-rays reviewed and today I did a proximal nerve block of the area I discussed risk of injection and I did sterile prep of the area I aspirated the joint getting out a small amount of clear fluid and injected with quarter cc of dexamethasone Kenalog and applied padding and advised on rigid bottom shoes.  Reappoint to recheck in the next several weeks  X-rays indicate that there is no signs of stress fracture or advanced arthritis currently

## 2017-11-27 ENCOUNTER — Encounter: Payer: Self-pay | Admitting: Podiatry

## 2017-11-27 ENCOUNTER — Ambulatory Visit (INDEPENDENT_AMBULATORY_CARE_PROVIDER_SITE_OTHER): Payer: Medicare Other | Admitting: Podiatry

## 2017-11-27 DIAGNOSIS — M779 Enthesopathy, unspecified: Secondary | ICD-10-CM | POA: Diagnosis not present

## 2017-11-30 NOTE — Progress Notes (Signed)
Subjective:   Patient ID: Hannah Jennings, female   DOB: 67 y.o.   MRN: [id number]   HPI Patient states she still has a lot of pain in her right foot and states that the medication helped temporarily but the pain is back to where it was before and she cannot tolerate rigid bottom shoes   ROS      Objective:  Physical Exam  Neurovascular status unchanged with patient found to have exquisite inflammation swelling around the second and mildly third metatarsal phalangeal joint right with no indications of digital displacement     Assessment:  Inflammatory capsulitis acute failure to respond so far to conservative treatment     Plan:  Today I went ahead and I placed this patient in the air fracture walker to completely immobilize the plantar surface advised on aggressive ice and patient will be seen back for [place] to recheck again in the next several weeks or earlier if needed

## 2017-12-03 NOTE — Addendum Note (Signed)
Addended by: Harriett Sine D on: 12/03/2017 04:28 PM   Modules accepted: Orders

## 2017-12-23 NOTE — Progress Notes (Signed)
HPI:   Hannah Jennings is a 67 y.o. female, who is here today for 6 months follow up.   She was last seen on 06/25/17 Since her last visit she has had right eye cataract removed and has follow-up with podiatrist due to right foot pain, capsulitis.   Bilateral LE and hip pain, both she has had for several years as well as mild lower back pain, no radiated.  Today she is reporting that pain on lower extremities and lower back have pretty much resolved.  Very occasionally she has some minimal left lower extremity achy sensation but is not limiting her daily activities. She is also sleeping better since pain has been well controlled.   She is on Cymbalta 60 mg daily.  Medication was started on 03/18/2016.   Depression: She denies depressed mood or suicidal thoughts. Anxiety has also improved. She is tolerating Cymbalta well.   Hyperlipidemia:  Currently on nonpharmacologic treatment. Following a low fat diet: Yes.   Lab Results  Component Value Date   CHOL 223 (H) 06/25/2017   HDL 56.90 06/25/2017   LDLCALC 130 (H) 06/25/2017   TRIG 180.0 (H) 06/25/2017   CHOLHDL 4 06/25/2017    Walking 1-2 miles a weeks and trying to eat healthy.  She decreased her snacking and portion size. She has noticed  that her clothes fit but frustrated because she does not see with numbers going down.  She was not able to walk for a few weeks due to foot pain but resumed once she felt better.   Review of Systems  Constitutional: Negative for activity change, appetite change, fatigue and fever.  HENT: Negative for mouth sores and nosebleeds.   Respiratory: Negative for shortness of breath and wheezing.   Cardiovascular: Negative for chest pain and leg swelling.  Gastrointestinal: Negative for abdominal pain, nausea and vomiting.       Negative for changes in bowel habits.  Genitourinary: Negative for decreased urine volume and hematuria.  Musculoskeletal: Negative for back  pain, gait problem and myalgias.  Skin: Negative for rash and wound.  Neurological: Negative for weakness, numbness and headaches.  Psychiatric/Behavioral: Negative for confusion and sleep disturbance. The patient is nervous/anxious.      Current Outpatient Medications on File Prior to Visit  Medication Sig Dispense Refill  . CALCIUM CITRATE PO Take 1 tablet by mouth daily.    . cholecalciferol (VITAMIN D) 1000 UNITS tablet Take 1,000 Units by mouth daily.     Marland Kitchen conjugated estrogens (PREMARIN) vaginal cream Use 1/2 gram in vagina 2 - 3 times a week to control symptoms. 42.5 g 1  . desonide (DESOWEN) 0.05 % ointment Apply 1 application topically daily as needed. For up to 2 weeks at the time. 15 g 1  . esomeprazole (NEXIUM) 40 MG capsule Take 40 mg by mouth daily at 12 noon.    Marland Kitchen ibuprofen (ADVIL,MOTRIN) 600 MG tablet Take 600 mg by mouth as needed.     . Zoster Vaccine Adjuvanted [facility]) injection 0.5 ml in muscle and repeat in 8 weeks (Patient not taking: Reported on 12/24/2017) 0.5 mL 1   No current facility-administered medications on file prior to visit.      Past Medical History:  Diagnosis Date  . Cancer ([place]) 2018   basal cell--chest and left ear  . Oral lichen planus    --hx of oral--pt. states for years  . Osteoporosis 03-2012   T-score 1.5/2.8    No Known Allergies  Social History   Socioeconomic History  . Marital status: Married    Spouse name: Not on file  . Number of children: Not on file  . Years of education: Not on file  . Highest education level: Not on file  Occupational History  . Not on file  Social Needs  . Financial resource strain: Not on file  . Food insecurity:    Worry: Not on file    Inability: Not on file  . Transportation needs:    Medical: Not on file    Non-medical: Not on file  Tobacco Use  . Smoking status: Former Smoker    Last attempt to quit: 01/26/1988    Years since quitting: 29.9  . Smokeless tobacco: Never Used  Substance  and Sexual Activity  . Alcohol use: Yes    Alcohol/week: 1.8 oz    Types: 3 Glasses of wine per week  . Drug use: No  . Sexual activity: Yes    Partners: Male    Birth control/protection: Surgical    Comment: BTL  Lifestyle  . Physical activity:    Days per week: Not on file    Minutes per session: Not on file  . Stress: Not on file  Relationships  . Social connections:    Talks on phone: Not on file    Gets together: Not on file    Attends religious service: Not on file    Active member of club or organization: Not on file    Attends meetings of clubs or organizations: Not on file    Relationship status: Not on file  Other Topics Concern  . Not on file  Social History Narrative  . Not on file    Vitals:   12/24/17 0756  BP: 120/82  Pulse: 87  Resp: 12  Temp: 98.1 F (36.7 C)  SpO2: 97%   Body mass index is 24.93 kg/m.   Wt Readings from Last 3 Encounters:  12/24/17 145 lb 2 oz (65.8 kg)  06/25/17 143 lb 6 oz (65 kg)  03/19/17 142 lb 6.4 oz (64.6 kg)     Physical Exam  Nursing note and vitals reviewed. Constitutional: She is oriented to person, place, and time. She appears well-developed and well-nourished. No distress.  HENT:  Head: Normocephalic and atraumatic.  Mouth/Throat: Oropharynx is clear and moist and mucous membranes are normal.  Eyes: Pupils are equal, round, and reactive to light. Conjunctivae are normal.  Cardiovascular: Normal rate and regular rhythm.  No murmur heard. Pulses:      Dorsalis pedis pulses are 2+ on the right side, and 2+ on the left side.  Respiratory: Effort normal and breath sounds normal. No respiratory distress.  GI: Soft. She exhibits no mass. There is no hepatomegaly. There is no tenderness.  Musculoskeletal: She exhibits no edema or tenderness.       Lumbar back: She exhibits no tenderness and no bony tenderness.  Hip ROM normal, it does not elicit pain.   Lymphadenopathy:    She has no cervical adenopathy.    Neurological: She is alert and oriented to person, place, and time. She has normal strength. Gait normal.  Skin: Skin is warm. No rash noted. No erythema.  Psychiatric: She has a normal mood and affect.  Well groomed, good eye contact.       ASSESSMENT AND PLAN:   Ms. Keiosha Cancro was seen today for 6 months follow-up.  Orders Placed This Encounter  Procedures  . Lipid panel  Lab Results  Component Value Date   CHOL 251 (H) 12/24/2017   HDL 64.80 12/24/2017   LDLCALC 151 (H) 12/24/2017   TRIG 174.0 (H) 12/24/2017   CHOLHDL 4 12/24/2017   The 10-year ASCVD risk score Mikey Bussing DC Jr., et al., 2013) is: 6.4%   Values used to calculate the score:     Age: 39 years     Sex: Female     Is Non-Hispanic African American: No     Diabetic: No     Tobacco smoker: No     Systolic Blood Pressure: 759 mmHg     Is BP treated: No     HDL Cholesterol: 64.8 mg/dL     Total Cholesterol: 251 mg/dL   Arthralgia of both lower legs Reporting great improvement since she started Cymbalta 60 mg daily. Since she is tolerating medication well and problem is well controlled I think is appropriate to follow annually, before if needed.   Depression, major, recurrent, mild ([place]) Depression and anxiety well-controlled. Continue Cymbalta 60 mg daily. Follow-up in a year, before if needed.  Hyperlipidemia I recommend continuing a low-fat diet. Further recommendation will be given according to lipid panel results as well as 10 years CVD risk score.      Jozi Malachi G. [place], MD  [facility]. [place] office.

## 2017-12-24 ENCOUNTER — Encounter: Payer: Self-pay | Admitting: Family Medicine

## 2017-12-24 ENCOUNTER — Ambulatory Visit (INDEPENDENT_AMBULATORY_CARE_PROVIDER_SITE_OTHER): Payer: Medicare Other | Admitting: Family Medicine

## 2017-12-24 VITALS — BP 120/82 | HR 87 | Temp 98.1°F | Resp 12 | Ht 63.98 in | Wt 145.1 lb

## 2017-12-24 DIAGNOSIS — E782 Mixed hyperlipidemia: Secondary | ICD-10-CM

## 2017-12-24 DIAGNOSIS — F330 Major depressive disorder, recurrent, mild: Secondary | ICD-10-CM

## 2017-12-24 DIAGNOSIS — M25561 Pain in right knee: Secondary | ICD-10-CM

## 2017-12-24 DIAGNOSIS — M25562 Pain in left knee: Secondary | ICD-10-CM

## 2017-12-24 LAB — LIPID PANEL
CHOLESTEROL: 251 mg/dL — AB (ref 0–200)
HDL: 64.8 mg/dL (ref >39.00)
LDL CALC: 151 mg/dL — AB (ref 0–99)
NonHDL: 186.28
TRIGLYCERIDES: 174 mg/dL — AB (ref 0.0–149.0)
Total CHOL/HDL Ratio: 4
VLDL: 34.8 mg/dL (ref 0.0–40.0)

## 2017-12-24 MED ORDER — DULOXETINE HCL 60 MG PO CPEP: 60.0000 mg | ORAL_CAPSULE | Freq: Every day | ORAL | 3 refills | Status: DC

## 2017-12-24 NOTE — Patient Instructions (Addendum)
A few things to remember from today's visit:   Arthralgia of both lower legs - Plan: DULoxetine (CYMBALTA) 60 MG capsule  Depression, major, recurrent, mild ([facility]) - Plan: DULoxetine (CYMBALTA) 60 MG capsule  Mixed hyperlipidemia - Plan: Lipid panel   [name], today we have followed on some of your chronic medical problems and they seem to be stable, so no changes in current management today.  Review medication list and be sure it is accurate.  -Remember a healthy diet and regular physical activity are very important for prevention as well as for well being; they also help with many chronic problems, decreasing the need of adding new medications and delaying or preventing possible complications.  I will see you back in 12 months.  Remember to arrange your follow up appt before leaving today.  Please follow sooner than planned if a new concern arises.  Please be sure medication list is accurate. If a new problem present, please set up appointment sooner than planned today.

## 2017-12-24 NOTE — Assessment & Plan Note (Addendum)
Depression and anxiety well-controlled. Continue Cymbalta 60 mg daily. Follow-up in a year, before if needed.

## 2017-12-24 NOTE — Assessment & Plan Note (Signed)
I recommend continuing a low-fat diet. Further recommendation will be given according to lipid panel results as well as 10 years CVD risk score.

## 2017-12-24 NOTE — Assessment & Plan Note (Signed)
Reporting great improvement since she started Cymbalta 60 mg daily. Since she is tolerating medication well and problem is well controlled I think is appropriate to follow annually, before if needed.

## 2018-01-30 ENCOUNTER — Ambulatory Visit (INDEPENDENT_AMBULATORY_CARE_PROVIDER_SITE_OTHER): Payer: Medicare Other

## 2018-01-30 ENCOUNTER — Ambulatory Visit (INDEPENDENT_AMBULATORY_CARE_PROVIDER_SITE_OTHER): Payer: Medicare Other | Admitting: Podiatry

## 2018-01-30 ENCOUNTER — Other Ambulatory Visit: Payer: Self-pay | Admitting: Podiatry

## 2018-01-30 ENCOUNTER — Encounter: Payer: Self-pay | Admitting: Podiatry

## 2018-01-30 DIAGNOSIS — M1000 Idiopathic gout, unspecified site: Secondary | ICD-10-CM | POA: Diagnosis not present

## 2018-01-30 DIAGNOSIS — M7751 Other enthesopathy of right foot: Secondary | ICD-10-CM

## 2018-01-30 DIAGNOSIS — M779 Enthesopathy, unspecified: Secondary | ICD-10-CM

## 2018-01-30 DIAGNOSIS — M79671 Pain in right foot: Secondary | ICD-10-CM

## 2018-01-31 NOTE — Progress Notes (Signed)
Subjective:   Patient ID: Hannah Jennings, female   DOB: 67 y.o.   MRN: [id number]   HPI Patient states she still having quite a bit of pain in her right second metatarsal phalangeal joint with inflammation fluid around the joint surface with mild improvement with what we have done previously   ROS      Objective:  Physical Exam  Neurovascular status unchanged with patient's right second MPJ being swollen and inflamed at this time     Assessment:  Possibility that there may be a significant flexor plate tear or other pathological process with also changes in bone structure     Plan:  X-rays reviewed indicating that there is some reactive bone change with in the second metatarsal phalangeal joint indicating there is some kind of an active process.  I am going to go ahead and order an MRI as there appears to be something active and I cannot judge what and I am also ordering arthritic profile  X-ray indicated reactive changes within the second metatarsal phalangeal joint of the right foot

## 2018-02-03 LAB — C-REACTIVE PROTEIN: CRP: 1.9 mg/L (ref <8.0)

## 2018-02-03 LAB — RHEUMATOID FACTOR

## 2018-02-03 LAB — ANA, IFA COMPREHENSIVE PANEL
Anti Nuclear Antibody(ANA): NEGATIVE
ENA SM AB SER-ACNC: NEGATIVE AI
SCLERODERMA (SCL-70) (ENA) ANTIBODY, IGG: NEGATIVE AI
SM/RNP: <1 AI
SSA (RO) (ENA) ANTIBODY, IGG: NEGATIVE AI
SSB (LA) (ENA) ANTIBODY, IGG: NEGATIVE AI

## 2018-02-03 LAB — URIC ACID: Uric Acid, Serum: 4.6 mg/dL (ref 2.5–7.0)

## 2018-02-03 LAB — SEDIMENTATION RATE: SED RATE: 6 mm/h (ref 0–30)

## 2018-02-05 ENCOUNTER — Telehealth: Payer: Self-pay | Admitting: Podiatry

## 2018-02-05 NOTE — Telephone Encounter (Signed)
I saw Dr. Paulla Dolly last Friday and he said someone would contact me about an MRI. I have not heard anything and I don't know who to call or who to ask. If someone could call me about that and let me know if I need to keep waiting or what. My number is [phone]. Thanks.

## 2018-02-11 ENCOUNTER — Ambulatory Visit
Admission: RE | Admit: 2018-02-11 | Discharge: 2018-02-11 | Disposition: A | Discharge status: Home / Self Care | Payer: Medicare Other | Source: Ambulatory Visit | Attending: Podiatry | Admitting: Podiatry

## 2018-02-11 ENCOUNTER — Inpatient Hospital Stay: Admission: RE | Admit: 2018-02-11 | Payer: Medicare Other | Source: Ambulatory Visit

## 2018-02-11 DIAGNOSIS — M1000 Idiopathic gout, unspecified site: Secondary | ICD-10-CM

## 2018-02-11 DIAGNOSIS — M779 Enthesopathy, unspecified: Secondary | ICD-10-CM

## 2018-02-11 DIAGNOSIS — M79671 Pain in right foot: Secondary | ICD-10-CM

## 2018-02-19 ENCOUNTER — Ambulatory Visit (INDEPENDENT_AMBULATORY_CARE_PROVIDER_SITE_OTHER): Payer: Medicare Other

## 2018-02-19 ENCOUNTER — Other Ambulatory Visit: Payer: Self-pay | Admitting: Podiatry

## 2018-02-19 ENCOUNTER — Encounter: Payer: Self-pay | Admitting: Podiatry

## 2018-02-19 ENCOUNTER — Ambulatory Visit (INDEPENDENT_AMBULATORY_CARE_PROVIDER_SITE_OTHER): Payer: Medicare Other | Admitting: Podiatry

## 2018-02-19 DIAGNOSIS — M779 Enthesopathy, unspecified: Secondary | ICD-10-CM

## 2018-02-19 DIAGNOSIS — M1000 Idiopathic gout, unspecified site: Secondary | ICD-10-CM | POA: Diagnosis not present

## 2018-02-19 DIAGNOSIS — M10079 Idiopathic gout, unspecified ankle and foot: Secondary | ICD-10-CM

## 2018-02-19 DIAGNOSIS — M86679 Other chronic osteomyelitis, unspecified ankle and foot: Secondary | ICD-10-CM | POA: Diagnosis not present

## 2018-02-19 DIAGNOSIS — M79672 Pain in left foot: Secondary | ICD-10-CM | POA: Diagnosis not present

## 2018-02-19 DIAGNOSIS — M79671 Pain in right foot: Secondary | ICD-10-CM

## 2018-02-19 NOTE — Progress Notes (Signed)
Subjective:   Patient ID: Hannah Jennings, female   DOB: 67 y.o.   MRN: [id number]   HPI Patient presents stating she still having a lot of pain and she wants to do MRI and is not sure where to go from   ROS      Objective:  Physical Exam  Neurovascular status intact with patient having had numerous different conservative procedures including injection treatments cast immobilization physical therapy and anti-inflammatories.  Continues to have discomfort second MPJ right dorsal right foot and into the second digit with mild swelling but no redness erythema noted     Assessment:  Appears to be an inflammatory reaction but still somewhat confusing in its presentation     Plan:  Explained the most likely need to go in and do some form of biopsy but at this point will get a go ahead and get an arthritic profile and a CBC with differential and I will see if there is been any change.  I did rediscuss x-rays and reviewed case with Dr. March Rummage who is in agreement with me  X-rays indicate there is change around the second metatarsal phalangeal joint with bone formation and also on the medial side of the right hallux.  We reviewed MRI indicating probable inflammatory or possible fracture with possible osteomyelitis even though systemic signs do not seem to back this up

## 2018-02-23 LAB — CBC WITH DIFFERENTIAL/PLATELET
BASOS PCT: 0.8 %
Basophils Absolute: 40 cells/uL (ref 0–200)
EOS ABS: 490 {cells}/uL (ref 15–500)
EOS PCT: 9.8 %
HCT: 42.3 % (ref 35.0–45.0)
HEMOGLOBIN: 14.2 g/dL (ref 11.7–15.5)
Lymphs Abs: 1240 cells/uL (ref 850–3900)
MCH: 29.2 pg (ref 27.0–33.0)
MCHC: 33.6 g/dL (ref 32.0–36.0)
MCV: 86.9 fL (ref 80.0–100.0)
MONOS PCT: 7.2 %
MPV: 9.9 fL (ref 7.5–12.5)
NEUTROS ABS: 2870 {cells}/uL (ref 1500–7800)
Neutrophils Relative %: 57.4 %
Platelets: 278 10*3/uL (ref 140–400)
RBC: 4.87 10*6/uL (ref 3.80–5.10)
RDW: 12.3 % (ref 11.0–15.0)
Total Lymphocyte: 24.8 %
WBC mixed population: 360 cells/uL (ref 200–950)
WBC: 5 10*3/uL (ref 3.8–10.8)

## 2018-02-23 LAB — ANA, IFA COMPREHENSIVE PANEL
ANA: NEGATIVE
ENA SM Ab Ser-aCnc: <1 AI
SCLERODERMA (SCL-70) (ENA) ANTIBODY, IGG: NEGATIVE AI
SM/RNP: NEGATIVE AI
SSA (Ro) (ENA) Antibody, IgG: <1 AI
SSB (La) (ENA) Antibody, IgG: <1 AI

## 2018-02-23 LAB — URIC ACID: URIC ACID, SERUM: 4.9 mg/dL (ref 2.5–7.0)

## 2018-02-23 LAB — RHEUMATOID FACTOR: Rheumatoid fact SerPl-aCnc: <14 IU/mL (ref <14)

## 2018-02-23 LAB — C-REACTIVE PROTEIN: CRP: 1.6 mg/L (ref <8.0)

## 2018-02-23 LAB — SEDIMENTATION RATE: SED RATE: 9 mm/h (ref 0–30)

## 2018-02-25 ENCOUNTER — Ambulatory Visit: Payer: Medicare Other | Admitting: Podiatry

## 2018-02-26 ENCOUNTER — Encounter: Payer: Self-pay | Admitting: Podiatry

## 2018-02-26 ENCOUNTER — Ambulatory Visit (INDEPENDENT_AMBULATORY_CARE_PROVIDER_SITE_OTHER): Payer: Medicare Other

## 2018-02-26 ENCOUNTER — Other Ambulatory Visit: Payer: Self-pay | Admitting: Podiatry

## 2018-02-26 ENCOUNTER — Ambulatory Visit (INDEPENDENT_AMBULATORY_CARE_PROVIDER_SITE_OTHER): Payer: Medicare Other | Admitting: Podiatry

## 2018-02-26 ENCOUNTER — Telehealth: Payer: Self-pay | Admitting: Podiatry

## 2018-02-26 DIAGNOSIS — M779 Enthesopathy, unspecified: Secondary | ICD-10-CM

## 2018-02-26 DIAGNOSIS — M79671 Pain in right foot: Secondary | ICD-10-CM

## 2018-02-26 NOTE — Telephone Encounter (Signed)
I informed pt Dr. Paulla Dolly had all the information ready.

## 2018-02-26 NOTE — Telephone Encounter (Signed)
I have an appointment with Dr. Paulla Dolly today at 1:45 pm. He said he was going to go over some things from my labs with colleagues. I was wondering if everything is in place before I come in today or do I need to reschedule my appointment. Please call me back at [phone]. Thank you.

## 2018-02-26 NOTE — Patient Instructions (Signed)
Pre-Operative Instructions  Congratulations, you have decided to take an important step towards improving your quality of life.  You can be assured that the doctors and staff at [facility] will be with you every step of the way.  Here are some important things you should know:  1. Plan to be at the surgery center/hospital at least 1 (one) hour prior to your scheduled time, unless otherwise directed by the surgical center/hospital staff.  You must have a responsible adult accompany you, remain during the surgery and drive you home.  Make sure you have directions to the surgical center/hospital to ensure you arrive on time. 2. If you are having surgery at [facility] or  hospitals, you will need a copy of your medical history and physical form from your family physician within one month prior to the date of surgery. We will give you a form for your primary physician to complete.  3. We make every effort to accommodate the date you request for surgery.  However, there are times where surgery dates or times have to be moved.  We will contact you as soon as possible if a change in schedule is required.   4. No aspirin/ibuprofen for one week before surgery.  If you are on aspirin, any non-steroidal anti-inflammatory medications (Mobic, Aleve, Ibuprofen) should not be taken seven (7) days prior to your surgery.  You make take Tylenol for pain prior to surgery.  5. Medications - If you are taking daily heart and blood pressure medications, seizure, reflux, allergy, asthma, anxiety, pain or diabetes medications, make sure you notify the surgery center/hospital before the day of surgery so they can tell you which medications you should take or avoid the day of surgery. 6. No food or drink after midnight the night before surgery unless directed otherwise by surgical center/hospital staff. 7. No alcoholic beverages 24-hours prior to surgery.  No smoking 24-hours prior or 24-hours after  surgery. 8. Wear loose pants or shorts. They should be loose enough to fit over bandages, boots, and casts. 9. Don't wear slip-on shoes. Sneakers are preferred. 10. Bring your boot with you to the surgery center/hospital.  Also bring crutches or a walker if your physician has prescribed it for you.  If you do not have this equipment, it will be provided for you after surgery. 11. If you have not been contacted by the surgery center/hospital by the day before your surgery, call to confirm the date and time of your surgery. 12. Leave-time from work may vary depending on the type of surgery you have.  Appropriate arrangements should be made prior to surgery with your employer. 13. Prescriptions will be provided immediately following surgery by your doctor.  Fill these as soon as possible after surgery and take the medication as directed. Pain medications will not be refilled on weekends and must be approved by the doctor. 14. Remove nail polish on the operative foot and avoid getting pedicures prior to surgery. 15. Wash the night before surgery.  The night before surgery wash the foot and leg well with water and the antibacterial soap provided. Be sure to pay special attention to beneath the toenails and in between the toes.  Wash for at least three (3) minutes. Rinse thoroughly with water and dry well with a towel.  Perform this wash unless told not to do so by your physician.  Enclosed: 1 Ice pack (please put in freezer the night before surgery)   1 Hibiclens skin cleaner     Pre-op instructions  If you have any questions regarding the instructions, please do not hesitate to call our office.  [place]: [address], [place], [place] [address] -- [phone]  [place]: [address] [place], [place] [address] -- [phone]  [place]: [address]  [place], [place] [address] -- [phone]  [place]: [address], [address], [place], [place] [address] -- [phone]  Website:  [email or web address] 

## 2018-03-01 NOTE — Progress Notes (Signed)
Subjective:   Patient ID: Hannah Jennings, female   DOB: 67 y.o.   MRN: [id number]   HPI Patient states my foot seems to be doing a little bit better but it still hurts a lot and I am going away in the next couple months.  Patient states that she still is not better and limits   ROS      Objective:  Physical Exam  Patient continues to exhibit swelling around the second MPJ right localized with no erythema no swelling of the toe or pain when the toe is palpated itself     Assessment:  Very difficult condition as to whether this is a systemic condition whether or not there may be some reactive bone process or whether it is just a localized inflammatory condition     Plan:  I reviewed all of her blood work and her MRI indicating that there could possibly be an inflammatory process possibility of a infection process or other pathological process.  Due to the uncertainty of this I have recommended exploration surgery cleaning up small bone spurs sending them off for bone biopsy and determination with the possibility that future surgery will be necessary.  Patient understands there is absolutely no long-term guarantees that this will solve her problem and that she may ultimately require more advanced antibiotic treatment or other treatments and it is possible to make any kind of long-term determination as to her and results.  She signed consent form at the current time understanding of all of this and is scheduled for outpatient surgery  X-ray indicates there continues to be small little reactive changes within the interval is within the metatarsal phalangeal joint right and again I did review those findings with Dr. Carman Ching and Dr. Milinda Pointer were both in agreement that exploratory surgery is necessary

## 2018-03-03 ENCOUNTER — Encounter: Payer: Self-pay | Admitting: Podiatry

## 2018-03-03 DIAGNOSIS — M86671 Other chronic osteomyelitis, right ankle and foot: Secondary | ICD-10-CM

## 2018-03-06 ENCOUNTER — Encounter: Payer: Self-pay | Admitting: Podiatry

## 2018-03-11 ENCOUNTER — Encounter: Payer: Self-pay | Admitting: *Deleted

## 2018-03-11 ENCOUNTER — Ambulatory Visit (INDEPENDENT_AMBULATORY_CARE_PROVIDER_SITE_OTHER): Payer: Medicare Other

## 2018-03-11 ENCOUNTER — Ambulatory Visit (INDEPENDENT_AMBULATORY_CARE_PROVIDER_SITE_OTHER): Payer: Medicare Other | Admitting: Podiatry

## 2018-03-11 ENCOUNTER — Encounter: Payer: Self-pay | Admitting: Podiatry

## 2018-03-11 VITALS — BP 124/75 | HR 101 | Temp 97.7°F

## 2018-03-11 DIAGNOSIS — Z9889 Other specified postprocedural states: Secondary | ICD-10-CM

## 2018-03-11 DIAGNOSIS — M86671 Other chronic osteomyelitis, right ankle and foot: Secondary | ICD-10-CM | POA: Diagnosis not present

## 2018-03-11 NOTE — Progress Notes (Signed)
This encounter was created in error - please disregard.

## 2018-03-14 NOTE — Progress Notes (Signed)
   Subjective:  Patient presents today status post exploration of the 2nd MPJ of the right foot. DOS: 03/03/18. She states she is doing well. She reports mild, intermittent pain but is not concerned by it. There are no modifying factors noted. She has been using the post op shoe as directed. Patient is here for further evaluation and treatment.    Past Medical History:  Diagnosis Date  . Cancer ([place]) 2018   basal cell--chest and left ear  . Oral lichen planus    --hx of oral--pt. states for years  . Osteoporosis 03-2012   T-score 1.5/2.8       Objective/Physical Exam Neurovascular status intact.  Skin incisions appear to be well coapted with sutures and staples intact. No sign of infectious process noted. No dehiscence. No active bleeding noted. Moderate edema noted to the surgical extremity.  Radiographic Exam:  Osteotomies sites appear to be stable with routine healing.  Assessment: 1. s/p exploration of the 2nd MPJ right. DOS: 03/03/18   Plan of Care:  1. Patient was evaluated. X-rays reviewed 2. Dressing changed.  3. Continue weightbearing in the post op shoe.  4. Return to clinic in one week to review bone biopsy results with Dr. Paulla Dolly.    Edrick Kins, DPM [facility]  Dr. Edrick Kins, [address]                                        [place], [place] [address]                Office [phone]  Fax [phone]

## 2018-03-19 ENCOUNTER — Ambulatory Visit (INDEPENDENT_AMBULATORY_CARE_PROVIDER_SITE_OTHER): Payer: Medicare Other | Admitting: Podiatry

## 2018-03-19 ENCOUNTER — Encounter: Payer: Self-pay | Admitting: Podiatry

## 2018-03-19 ENCOUNTER — Ambulatory Visit (INDEPENDENT_AMBULATORY_CARE_PROVIDER_SITE_OTHER): Payer: Medicare Other

## 2018-03-19 DIAGNOSIS — Z9889 Other specified postprocedural states: Secondary | ICD-10-CM | POA: Diagnosis not present

## 2018-03-19 DIAGNOSIS — M79671 Pain in right foot: Secondary | ICD-10-CM

## 2018-03-19 NOTE — Progress Notes (Signed)
Subjective:   Patient ID: Hannah Jennings, female   DOB: 67 y.o.   MRN: [id number]   HPI Patient states that it seems to be improved with swelling still noted but not having the same sharp pain she was having previously   ROS      Objective:  Physical Exam  Neurovascular status intact with wound edges well coapted second toe in good alignment and no crepitus upon range of motion of the second MPJ     Assessment:  Appears to be making improvement in the right second MPJ with no current clinical findings on tests that were performed at the time of the procedure     Plan:  Reviewed x-ray stitches removed wound edges coapted well and applied ankle compression stocking with gradual increase in activity levels range of motion exercises and return to shoe gear in the next 1 to 2 weeks.  Reappoint to be 4 weeks or earlier if needed  X-ray indicates that there still is a small amount of material in the second MPJ but most of it is been removed and the joint is much more open than it was previously with no indications of pathology of the second metatarsal head

## 2018-03-23 ENCOUNTER — Other Ambulatory Visit: Payer: Self-pay

## 2018-03-23 ENCOUNTER — Encounter: Payer: Self-pay | Admitting: Obstetrics and Gynecology

## 2018-03-23 ENCOUNTER — Ambulatory Visit (INDEPENDENT_AMBULATORY_CARE_PROVIDER_SITE_OTHER): Payer: Medicare Other | Admitting: Obstetrics and Gynecology

## 2018-03-23 VITALS — BP 120/80 | HR 90 | Ht 64.0 in | Wt 143.0 lb

## 2018-03-23 DIAGNOSIS — Z780 Asymptomatic menopausal state: Secondary | ICD-10-CM | POA: Diagnosis not present

## 2018-03-23 DIAGNOSIS — Z01419 Encounter for gynecological examination (general) (routine) without abnormal findings: Secondary | ICD-10-CM

## 2018-03-23 DIAGNOSIS — M8580 Other specified disorders of bone density and structure, unspecified site: Secondary | ICD-10-CM | POA: Diagnosis not present

## 2018-03-23 HISTORY — PX: OTHER SURGICAL HISTORY: SHX169

## 2018-03-23 NOTE — Patient Instructions (Signed)

## 2018-03-23 NOTE — Progress Notes (Signed)
67 y.o. G41P2002 [name] here for annual exam.    Using vaginal cream as needed, at least once per week. Sometimes uses twice per week.  Does not need refill.  She will call for this.   Denies any vaginal bleeding or spotting.   Needs vit D check done.   PCP:  Betty Martinique, MD   Patient's last menstrual period was 06/24/2002 (approximate).     Period Cycle (Days): (postmenopausal)     Sexually active: Yes.    The current method of family planning is tubal ligation.    Exercising: Yes.    walking Smoker:  no  Health Maintenance: Pap:  03/19/2017 normal History of abnormal Pap:  no MMG:  06/04/2016 BI-RADS CATEGORY  1: Negative.   Colonoscopy:  2015 normal with Dr. Fredia Beets due 2019/2020 due to family history of colon cancer in father. BMD:   06/04/2016 Osteopenic.  Used Fosamax in past. Actonel caused leg pain.  TDaP:  12/2017 Gardasil:   no HIV: never Hep C: negative 06/25/2017 Screening Labs:done with pcp  Hb today: n/a, Urine today: n/a   reports that she quit smoking about 30 years ago. She has never used smokeless tobacco. She reports that she drinks about 3.0 standard drinks of alcohol per week. She reports that she does not use drugs.  Past Medical History:  Diagnosis Date  . Cancer ([place]) 2018   basal cell--chest and left ear  . Oral lichen planus    --hx of oral--pt. states for years  . Osteoporosis 03-2012   T-score 1.5/2.8     Past Surgical History:  Procedure Laterality Date  . RETINAL TEAR REPAIR CRYOTHERAPY Right   . salivatory gland  1990   Blocked salivatory gland removed  . TONSILLECTOMY AND ADENOIDECTOMY  Age 55  . TUBAL LIGATION  1980    Current Outpatient Medications  Medication Sig Dispense Refill  . CALCIUM CITRATE PO Take 1 tablet by mouth daily.    . cholecalciferol (VITAMIN D) 1000 UNITS tablet Take 1,000 Units by mouth daily.     Marland Kitchen conjugated estrogens (PREMARIN) vaginal cream Use 1/2 gram in vagina 2 - 3 times a  week to control symptoms. 42.5 g 1  . desonide (DESOWEN) 0.05 % ointment Apply 1 application topically daily as needed. For up to 2 weeks at the time. 15 g 1  . DULoxetine (CYMBALTA) 60 MG capsule Take 1 capsule (60 mg total) by mouth daily. 90 capsule 3  . esomeprazole (NEXIUM) 40 MG capsule Take 40 mg by mouth daily at 12 noon.    Marland Kitchen ibuprofen (ADVIL,MOTRIN) 600 MG tablet Take 600 mg by mouth as needed.     . Zoster Vaccine Adjuvanted [facility] injection 0.5 ml in muscle and repeat in 8 weeks 0.5 mL 1   No current facility-administered medications for this visit.     Family History  Problem Relation Age of Onset  . Hypertension Mother   . Atrial fibrillation Mother   . Dementia Mother   . Hypertension Father   . Cancer Father        colon  . Neuropathy Sister   . Diabetes Maternal Grandmother   . Cancer Paternal Grandmother     Review of Systems  Constitutional: Negative.   HENT: Negative.   Eyes: Negative.   Respiratory: Negative.   Cardiovascular: Negative.   Gastrointestinal: Negative.   Endocrine: Negative.   Genitourinary: Negative.   Musculoskeletal: Negative.   Skin: Negative.   Allergic/Immunologic: Negative.   Neurological:  Negative.   Hematological: Negative.   Psychiatric/Behavioral: Negative.   All other systems reviewed and are negative.   Exam:   BP 120/80   Pulse 90   Ht 5\' 4"  (1.626 m)   Wt 143 lb (64.9 kg)   LMP 06/24/2002 (Approximate)   BMI 24.55 kg/m     General appearance: alert, cooperative and appears stated age Head: Normocephalic, without obvious abnormality, atraumatic Neck: no adenopathy, supple, symmetrical, trachea midline and thyroid normal to inspection and palpation Lungs: clear to auscultation bilaterally Breasts: normal appearance, no masses or tenderness, No nipple retraction or dimpling, No nipple discharge or bleeding, No axillary or supraclavicular adenopathy Heart: regular rate and rhythm Abdomen: soft, non-tender; no  masses, no organomegaly Extremities: extremities normal, atraumatic, no cyanosis or edema Skin: Skin color, texture, turgor normal. No rashes or lesions Lymph nodes: Cervical, supraclavicular, and axillary nodes normal. No abnormal inguinal nodes palpated Neurologic: Grossly normal  Pelvic: External genitalia:  no lesions              Urethra:  normal appearing urethra with no masses, tenderness or lesions              Bartholins and Skenes: normal                 Vagina: normal appearing vagina with normal color and discharge, no lesions              Cervix: no lesions              Pap taken: No. Bimanual Exam:  Uterus:  normal size, contour, position, consistency, mobility, non-tender              Adnexa: no mass, fullness, tenderness              Rectal exam: Yes.  .  Confirms.              Anus:  normal sphincter tone, no lesions  Chaperone was present for exam.  Assessment:   Well woman visit with normal exam. Osteopenia.  Leg pain with Actonel.  Hx vit D deficiency.  Atrophy of vagina.  Plan: Mammogram screening.  I recommend she do this.  Recommended self breast awareness. Pap and HR HPV as above. Guidelines for Calcium, Vitamin D, regular exercise program including cardiovascular and weight bearing exercise. Check vit D level.  Other labs with PCP.  BMD this year.  Can refill vaginal estrogen if she has her mammogram done and it is normal. Follow up annually and prn.   After visit summary provided.

## 2018-03-24 LAB — VITAMIN D 25 HYDROXY (VIT D DEFICIENCY, FRACTURES): Vit D, 25-Hydroxy: 40 ng/mL (ref 30.0–100.0)

## 2018-03-25 ENCOUNTER — Other Ambulatory Visit: Payer: Self-pay | Admitting: Obstetrics and Gynecology

## 2018-03-25 DIAGNOSIS — Z1231 Encounter for screening mammogram for malignant neoplasm of breast: Secondary | ICD-10-CM

## 2018-04-01 NOTE — Progress Notes (Signed)
DOS: 03/03/2018 Exploration 2nd MPJ, RT Bone Biopsy  Dr. Elvina Mattes

## 2018-04-16 ENCOUNTER — Ambulatory Visit (INDEPENDENT_AMBULATORY_CARE_PROVIDER_SITE_OTHER): Payer: Medicare Other | Admitting: Podiatry

## 2018-04-16 ENCOUNTER — Encounter: Payer: Self-pay | Admitting: Podiatry

## 2018-04-16 ENCOUNTER — Other Ambulatory Visit: Payer: Self-pay | Admitting: Podiatry

## 2018-04-16 ENCOUNTER — Ambulatory Visit (INDEPENDENT_AMBULATORY_CARE_PROVIDER_SITE_OTHER): Payer: Medicare Other

## 2018-04-16 DIAGNOSIS — M2011 Hallux valgus (acquired), right foot: Secondary | ICD-10-CM

## 2018-04-16 DIAGNOSIS — M79671 Pain in right foot: Secondary | ICD-10-CM

## 2018-04-16 NOTE — Progress Notes (Signed)
Subjective:   Patient ID: Hannah Jennings, female   DOB: 67 y.o.   MRN: [id number]   HPI Patient states she is feeling better with still mild swelling but seems improved   ROS      Objective:  Physical Exam  Neurovascular status intact with patient second MPJ right continuing to improve with increased motion minimal discomfort mild swelling     Assessment:  Appears to be getting better from some kind of a abnormal bone process second MPJ right F2 H&P x-ray reviewed and at this point and allowing patient to return to normal activity and I am discharging and if symptoms were to recur or swelling persist patient will be seen back  X-rays indicate that the patient's pathology appears to dramatically gotten better with the joint open now with no indications of any kind of foreign body or bone spur formation     Plan:  All listed above

## 2018-06-08 ENCOUNTER — Ambulatory Visit
Admission: RE | Admit: 2018-06-08 | Discharge: 2018-06-08 | Disposition: A | Discharge status: Home / Self Care | Payer: Medicare Other | Source: Ambulatory Visit | Attending: Obstetrics and Gynecology | Admitting: Obstetrics and Gynecology

## 2018-06-08 DIAGNOSIS — Z1231 Encounter for screening mammogram for malignant neoplasm of breast: Secondary | ICD-10-CM

## 2018-06-08 DIAGNOSIS — M8580 Other specified disorders of bone density and structure, unspecified site: Secondary | ICD-10-CM

## 2018-06-08 DIAGNOSIS — Z780 Asymptomatic menopausal state: Secondary | ICD-10-CM

## 2018-06-19 NOTE — Progress Notes (Signed)
GYNECOLOGY  VISIT   HPI: 67 y.o.   Married  Caucasian  female   G2P2002 with Patient's last menstrual period was 06/24/2002 (approximate).   here for BMD consult.     BMD showing T score of left hip -2.2, and spine -1.3. FRAX showing 20.9% major fracture risk and 4.3% hip fracture risk.  Used Actonel in past and had leg pain.  Been off for less than 10 years.  Started Cymbalta since then and is doing well. Also has some depression issues, so this is working well.  Used Fosamax also and took for about 5 years for actual osteoporosis of the hip. See BMD 04/14/12. Had GI issues and so transitioned to Actonel.  Takes Nexium daily.   Has some intolerance to dairy.   PCP - Betty Martinique.  GYNECOLOGIC HISTORY: Patient's last menstrual period was 06/24/2002 (approximate). Contraception: Tubal Menopausal hormone therapy:  Last mammogram: 06-08-18 3D Neg/density B/BiRads1 Last pap smear: 03-19-17 Neg, 02-01-15 Neg        OB History    Gravida  2   Para  2   Term  2   Preterm      AB      Living  2     SAB      TAB      Ectopic      Multiple      Live Births                 Patient Active Problem List   Diagnosis Date Noted  . Dermatitis of ear canal, bilateral 06/13/2016  . Hyperlipidemia 06/13/2016  . Arthralgia of both lower legs 03/18/2016  . Insomnia, unspecified 03/18/2016  . 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 Osteoporosis, unspecified 01/26/2014    Past Medical History:  Diagnosis Date  . Cancer ([place]) 2018   basal cell--chest and left ear  . Oral lichen planus    --hx of oral--pt. states for years  . Osteoporosis 03-2012   T-score 1.5/2.8     Past Surgical History:  Procedure Laterality Date  . RETINAL TEAR REPAIR CRYOTHERAPY Right   . right foot surgery Right 03/23/2018  . salivatory gland  1990   Blocked salivatory gland removed  . TONSILLECTOMY AND ADENOIDECTOMY  Age 14  . TUBAL LIGATION  1980    Current  Outpatient Medications  Medication Sig Dispense Refill  . CALCIUM CITRATE PO Take 1 tablet by mouth daily.    . cholecalciferol (VITAMIN D) 1000 UNITS tablet Take 1,000 Units by mouth daily.     Marland Kitchen conjugated estrogens (PREMARIN) vaginal cream Use 1/2 gram in vagina 2 - 3 times a week to control symptoms. 42.5 g 1  . desonide (DESOWEN) 0.05 % ointment Apply 1 application topically daily as needed. For up to 2 weeks at the time. 15 g 1  . DULoxetine (CYMBALTA) 60 MG capsule Take 1 capsule (60 mg total) by mouth daily. 90 capsule 3  . esomeprazole (NEXIUM) 40 MG capsule Take 40 mg by mouth daily at 12 noon.    Marland Kitchen ibuprofen (ADVIL,MOTRIN) 600 MG tablet Take 600 mg by mouth as needed.      No current facility-administered medications for this visit.      ALLERGIES: Patient has no known allergies.  Family History  Problem Relation Age of Onset  . Hypertension Mother   . Atrial fibrillation Mother   . Dementia Mother   . Hypertension Father   . Cancer Father  colon  . Neuropathy Sister   . Diabetes Maternal Grandmother   . Cancer Paternal Grandmother     Social History   Socioeconomic History  . Marital status: Married    Spouse name: Not on file  . Number of children: Not on file  . Years of education: Not on file  . Highest education level: Not on file  Occupational History  . Not on file  Social Needs  . Financial resource strain: Not on file  . Food insecurity:    Worry: Not on file    Inability: Not on file  . Transportation needs:    Medical: Not on file    Non-medical: Not on file  Tobacco Use  . Smoking status: Former Smoker    Last attempt to quit: 01/26/1988    Years since quitting: 30.4  . Smokeless tobacco: Never Used  Substance and Sexual Activity  . Alcohol use: Yes    Alcohol/week: 3.0 standard drinks    Types: 3 Glasses of wine per week  . Drug use: No  . Sexual activity: Yes    Partners: Male    Birth control/protection: Surgical    Comment: BTL   Lifestyle  . Physical activity:    Days per week: Not on file    Minutes per session: Not on file  . Stress: Not on file  Relationships  . Social connections:    Talks on phone: Not on file    Gets together: Not on file    Attends religious service: Not on file    Active member of club or organization: Not on file    Attends meetings of clubs or organizations: Not on file    Relationship status: Not on file  . Intimate partner violence:    Fear of current or ex partner: Not on file    Emotionally abused: Not on file    Physically abused: Not on file    Forced sexual activity: Not on file  Other Topics Concern  . Not on file  Social History Narrative  . Not on file    Review of Systems  All other systems reviewed and are negative.   PHYSICAL EXAMINATION:    BP 122/82 (BP Location: Right Arm, Patient Position: Sitting, Cuff Size: Normal)   Pulse 84   Ht 5\' 4"  (1.626 m)   Wt 145 lb (65.8 kg)   LMP 06/24/2002 (Approximate)   BMI 24.89 kg/m     General appearance: alert, cooperative and appears stated age   ASSESSMENT  Osteopenia.  Increase risk of fracture by FRAX model.  Leg pain treated with Cymbalta.  PLAN  Will check PTH and calcium. We discussed her osteopenia, increased fracture risk, and prior medication use.  I reviewed Evista and Prolia risks and benefits.  She would like to return to Actonel and take monthly.   If she has bone pain, she will stop the Actonel and contact me.  We reviewed fall risk reduction. Follow up for annual exam and prn.   An After Visit Summary was printed and given to the patient.  ____25__ minutes face to face time of which over 50% was spent in counseling.

## 2018-06-22 ENCOUNTER — Ambulatory Visit (INDEPENDENT_AMBULATORY_CARE_PROVIDER_SITE_OTHER): Payer: Medicare Other | Admitting: Obstetrics and Gynecology

## 2018-06-22 ENCOUNTER — Encounter: Payer: Self-pay | Admitting: Obstetrics and Gynecology

## 2018-06-22 ENCOUNTER — Other Ambulatory Visit: Payer: Self-pay

## 2018-06-22 VITALS — BP 122/82 | HR 84 | Ht 64.0 in | Wt 145.0 lb

## 2018-06-22 DIAGNOSIS — M8580 Other specified disorders of bone density and structure, unspecified site: Secondary | ICD-10-CM | POA: Diagnosis not present

## 2018-06-22 MED ORDER — RISEDRONATE SODIUM 150 MG PO TABS: 150.0000 mg | ORAL_TABLET | ORAL | 8 refills | Status: DC

## 2018-06-23 LAB — PTH, INTACT AND CALCIUM
Calcium: 9.1 mg/dL (ref 8.7–10.3)
PTH: 34 pg/mL (ref 15–65)

## 2018-06-29 ENCOUNTER — Encounter: Payer: Self-pay | Admitting: Family Medicine

## 2018-06-29 ENCOUNTER — Ambulatory Visit (INDEPENDENT_AMBULATORY_CARE_PROVIDER_SITE_OTHER): Payer: Medicare Other | Admitting: Family Medicine

## 2018-06-29 VITALS — BP 124/76 | HR 87 | Temp 98.4°F | Ht 64.0 in | Wt 145.0 lb

## 2018-06-29 DIAGNOSIS — Z0000 Encounter for general adult medical examination without abnormal findings: Secondary | ICD-10-CM | POA: Diagnosis not present

## 2018-06-29 DIAGNOSIS — M25561 Pain in right knee: Secondary | ICD-10-CM

## 2018-06-29 DIAGNOSIS — Z23 Encounter for immunization: Secondary | ICD-10-CM | POA: Diagnosis not present

## 2018-06-29 DIAGNOSIS — M810 Age-related osteoporosis without current pathological fracture: Secondary | ICD-10-CM | POA: Diagnosis not present

## 2018-06-29 DIAGNOSIS — E782 Mixed hyperlipidemia: Secondary | ICD-10-CM | POA: Diagnosis not present

## 2018-06-29 DIAGNOSIS — K219 Gastro-esophageal reflux disease without esophagitis: Secondary | ICD-10-CM | POA: Insufficient documentation

## 2018-06-29 DIAGNOSIS — M25562 Pain in left knee: Secondary | ICD-10-CM

## 2018-06-29 DIAGNOSIS — F330 Major depressive disorder, recurrent, mild: Secondary | ICD-10-CM

## 2018-06-29 DIAGNOSIS — H60543 Acute eczematoid otitis externa, bilateral: Secondary | ICD-10-CM

## 2018-06-29 LAB — COMPREHENSIVE METABOLIC PANEL
ALK PHOS: 72 U/L (ref 39–117)
ALT: 21 U/L (ref 0–35)
AST: 21 U/L (ref 0–37)
Albumin: 4.4 g/dL (ref 3.5–5.2)
BUN: 20 mg/dL (ref 6–23)
CALCIUM: 9.2 mg/dL (ref 8.4–10.5)
CO2: 29 mEq/L (ref 19–32)
Chloride: 103 mEq/L (ref 96–112)
Creatinine, Ser: 0.79 mg/dL (ref 0.40–1.20)
GFR: 76.83 mL/min (ref >60.00)
Glucose, Bld: 102 mg/dL — ABNORMAL HIGH (ref 70–99)
Potassium: 4.6 mEq/L (ref 3.5–5.1)
Sodium: 140 mEq/L (ref 135–145)
Total Bilirubin: 0.4 mg/dL (ref 0.2–1.2)
Total Protein: 7.1 g/dL (ref 6.0–8.3)

## 2018-06-29 LAB — LIPID PANEL
Cholesterol: 230 mg/dL — ABNORMAL HIGH (ref 0–200)
HDL: 63.3 mg/dL (ref >39.00)
LDL Cholesterol: 145 mg/dL — ABNORMAL HIGH (ref 0–99)
NonHDL: 167.09
Total CHOL/HDL Ratio: 4
Triglycerides: 112 mg/dL (ref 0.0–149.0)
VLDL: 22.4 mg/dL (ref 0.0–40.0)

## 2018-06-29 MED ORDER — DESONIDE 0.05 % EX OINT: 1.0000 "application " | TOPICAL_OINTMENT | Freq: Every day | CUTANEOUS | 3 refills | Status: DC | PRN

## 2018-06-29 NOTE — Assessment & Plan Note (Signed)
Probably has been well controlled with Cymbalta 60 mg daily. No changes in current management. I think it is appropriate to follow annually, before if needed.

## 2018-06-29 NOTE — Assessment & Plan Note (Signed)
Problem is well controlled. No changes in Nexium 40 mg daily. GERD precautions to continue.

## 2018-06-29 NOTE — Assessment & Plan Note (Signed)
Continue nonpharmacologic treatment. Further recommendation will be given according to lipid panel results as well as 10 years CVD score.

## 2018-06-29 NOTE — Patient Instructions (Addendum)
A few things to remember from today's visit:   Medicare annual wellness visit, subsequent  Dermatitis of ear canal, bilateral - Plan: desonide (DESOWEN) 0.05 % ointment  Arthralgia of both lower legs  Mixed hyperlipidemia - Plan: Comprehensive metabolic panel, Lipid panel  A few tips:  -As we age balance is not as good as it was, so there is a higher risks for falls. Please remove small rugs and furniture that is "in your way" and could increase the risk of falls. Stretching exercises may help with fall prevention: Yoga and Tai Chi are some examples. Low impact exercise is better, so you are not very achy the next day.  -Sun screen and avoidance of direct sun light recommended. Caution with dehydration, if working outdoors be sure to drink enough fluids.  - Some medications are not safe as we age, increases the risk of side effects and can potentially interact with other medication you are also taken;  including some of over the counter medications. Be sure to let me know when you start a new medication even if it is a dietary/vitamin supplement.   -Healthy diet low in red meet/animal fat and sugar + regular physical activity is recommended.       Screening schedule for the next 5-10 years:  Colonoscopy due in 03/2021.  Glaucoma screening/eye exam every 1-2 years.  Mammogram for breast cancer screening annually.  Flu vaccine annually.  Diabetes screening   Fall prevention     Please be sure medication list is accurate. If a new problem present, please set up appointment sooner than planned today.

## 2018-06-29 NOTE — Progress Notes (Signed)
HPI:   [name] is a 68 y.o. female, who is here today for chronic problems management and Medicare wellness.   Last medicare preventive visit 06/25/17.  Regular exercise 3 or more time per week: Walking 2-3 times per week about a mile each time. Following a healthy diet: Yes. She lives with her husband.  Since her last visit she has had right foot surgery, pain is "much better."   Chronic medical problems: chronic LE pain,osteoporosis, anxiety/depression,GERD,HLD,and OA among some.    Immunization History  Administered Date(s) Administered  . Influenza, High Dose Seasonal PF 03/18/2016, 06/03/2017  . Pneumococcal Polysaccharide-23 06/25/2017  . Tdap 12/08/2007, 12/24/2017    Mammogram: 06/08/18 Colonoscopy: 03/2016, due in 03/2021. DEXA: 06/08/18  Hep C screening: 06/2017  Independent ADL's and IADL's. No falls in the past year and denies depression symptoms.  Functional Status Survey: Is the patient deaf or have difficulty hearing?: No Does the patient have difficulty seeing, even when wearing glasses/contacts?: No Does the patient have difficulty concentrating, remembering, or making decisions?: No Does the patient have difficulty walking or climbing stairs?: No Does the patient have difficulty dressing or bathing?: No Does the patient have difficulty doing errands alone such as visiting a doctor's office or shopping?: No  Fall Risk  06/25/2017  Falls in the past year? No     Providers she sees regularly:  Eye care provider: Dr Delman Cheadle Dermatologist as needed, Hx of [place]. Gyn. Dr Josefa Half, for her female preventive and osteoporosis. Last seen 05/2018. Podiatrist, Dr Paulla Dolly.  Depression screen [facility] 2/9 06/25/2017  Decreased Interest 0  Down, Depressed, Hopeless 0  PHQ - 2 Score 0      Visual Acuity Screening   Right eye Left eye Both eyes  Without correction: 20/40 20/30 20/30   With correction:       Hyperlipidemia: Currently on  non pharmacologic treatment. Following a low fat diet: Yes..  Lab Results  Component Value Date   CHOL 251 (H) 12/24/2017   HDL 64.80 12/24/2017   LDLCALC 151 (H) 12/24/2017   TRIG 174.0 (H) 12/24/2017   CHOLHDL 4 12/24/2017   Osteoporosis,she has been on Fosamax and Actonel in the past. She is planing on starting Actonel tomorrow. She is on Vit D and Ca++ supplementation.  Lower back pain,OA and lower extremity pain still well controlled with Cymbalta. Occasionally she has some achy like sensation in LLE, it does not affect her normal daily activities but most of the time she has no pain.  She denies depressed mood,anxiety,or suicidal thoughts. No side effects reported.  GERD: She is now on Nexium 40 mg daily, if she stops medication she develops heartburn. Problem is worse at night.  Denies abdominal pain, nausea, vomiting, changes in bowel habits, blood in stool or melena.  Chronic ear canal pruritus, bilateral. Negative for earache or hearing loss. Desonide cream has helped. She has not identified exacerbating or alleviating factors.  Hx of tinnitus,stable.   Review of Systems  Constitutional: Negative for activity change, appetite change, fatigue and fever.  HENT: Negative for mouth sores, nosebleeds and trouble swallowing.   Eyes: Negative for redness and visual disturbance.  Respiratory: Negative for cough, shortness of breath and wheezing.   Cardiovascular: Negative for chest pain, palpitations and leg swelling.  Gastrointestinal: Negative for abdominal pain, nausea and vomiting.       Negative for changes in bowel habits.  Genitourinary: Negative for decreased urine volume, dysuria and hematuria.  Musculoskeletal: Positive  for myalgias. Negative for gait problem.  Neurological: Negative for syncope, weakness and headaches.  Psychiatric/Behavioral: Negative for confusion. The patient is not nervous/anxious.       Current Outpatient Medications on File Prior to  Visit  Medication Sig Dispense Refill  . CALCIUM CITRATE PO Take 1 tablet by mouth daily.    . cholecalciferol (VITAMIN D) 1000 UNITS tablet Take 1,000 Units by mouth daily.     Marland Kitchen conjugated estrogens (PREMARIN) vaginal cream Use 1/2 gram in vagina 2 - 3 times a week to control symptoms. 42.5 g 1  . DULoxetine (CYMBALTA) 60 MG capsule Take 1 capsule (60 mg total) by mouth daily. 90 capsule 3  . esomeprazole (NEXIUM) 40 MG capsule Take 40 mg by mouth daily at 12 noon.    Marland Kitchen ibuprofen (ADVIL,MOTRIN) 600 MG tablet Take 600 mg by mouth as needed.     . risedronate (ACTONEL) 150 MG tablet Take 1 tablet (150 mg total) by mouth every 30 (thirty) days. with water on empty stomach, nothing by mouth or lie down for next 30 minutes. (Patient not taking: Reported on 06/29/2018) 1 tablet 8   No current facility-administered medications on file prior to visit.      Past Medical History:  Diagnosis Date  . Cancer ([place]) 2018   basal cell--chest and left ear  . Oral lichen planus    --hx of oral--pt. states for years  . Osteoporosis 03-2012   T-score 1.5/2.8     Past Surgical History:  Procedure Laterality Date  . RETINAL TEAR REPAIR CRYOTHERAPY Right   . right foot surgery Right 03/23/2018  . salivatory gland  1990   Blocked salivatory gland removed  . TONSILLECTOMY AND ADENOIDECTOMY  Age 37  . TUBAL LIGATION  1980    No Known Allergies  Family History  Problem Relation Age of Onset  . Hypertension Mother   . Atrial fibrillation Mother   . Dementia Mother   . Hypertension Father   . Cancer Father        colon  . Neuropathy Sister   . Diabetes Maternal Grandmother   . Cancer Paternal Grandmother     Social History   Socioeconomic History  . Marital status: Married    Spouse name: Not on file  . Number of children: Not on file  . Years of education: Not on file  . Highest education level: Not on file  Occupational History  . Not on file  Social Needs  . Financial resource  strain: Not on file  . Food insecurity:    Worry: Not on file    Inability: Not on file  . Transportation needs:    Medical: Not on file    Non-medical: Not on file  Tobacco Use  . Smoking status: Former Smoker    Last attempt to quit: 01/26/1988    Years since quitting: 30.4  . Smokeless tobacco: Never Used  Substance and Sexual Activity  . Alcohol use: Yes    Alcohol/week: 3.0 standard drinks    Types: 3 Glasses of wine per week  . Drug use: No  . Sexual activity: Yes    Partners: Male    Birth control/protection: Surgical    Comment: BTL  Lifestyle  . Physical activity:    Days per week: Not on file    Minutes per session: Not on file  . Stress: Not on file  Relationships  . Social connections:    Talks on phone: Not on file  Gets together: Not on file    Attends religious service: Not on file    Active member of club or organization: Not on file    Attends meetings of clubs or organizations: Not on file    Relationship status: Not on file  Other Topics Concern  . Not on file  Social History Narrative  . Not on file     Vitals:   06/29/18 0757  BP: 124/76  Pulse: 87  Temp: 98.4 F (36.9 C)  SpO2: 98%   Body mass index is 24.89 kg/m.   Wt Readings from Last 3 Encounters:  06/29/18 145 lb (65.8 kg)  06/22/18 145 lb (65.8 kg)  03/23/18 143 lb (64.9 kg)      Physical Exam  Nursing note and vitals reviewed. Constitutional: She is oriented to person, place, and time. She appears well-developed and well-nourished. No distress.  HENT:  Head: Normocephalic and atraumatic.  Mouth/Throat: Oropharynx is clear and moist and mucous membranes are normal.  Ear canal with small scaly areas, no edema or erythema. No pain with pulling pinna or pressing tragus.  Eyes: Pupils are equal, round, and reactive to light. Conjunctivae are normal.  Cardiovascular: Normal rate and regular rhythm.  No murmur heard. Pulses:      Dorsalis pedis pulses are 2+ on the right  side and 2+ on the left side.  Respiratory: Effort normal and breath sounds normal. No respiratory distress.  GI: Soft. She exhibits no mass. There is no hepatomegaly. There is no abdominal tenderness.  Musculoskeletal:        General: No edema.  Lymphadenopathy:    She has no cervical adenopathy.  Neurological: She is alert and oriented to person, place, and time. She has normal strength. No cranial nerve deficit. Gait normal.  Skin: Skin is warm. No rash noted. No erythema.  Psychiatric: She has a normal mood and affect.  Well groomed, good eye contact.     ASSESSMENT AND PLAN:  Ms. Calista Crain was here today annual physical examination.   Orders Placed This Encounter  Procedures  . Flu vaccine HIGH DOSE PF  . Comprehensive metabolic panel  . Lipid panel   Lab Results  Component Value Date   CHOL 230 (H) 06/29/2018   HDL 63.30 06/29/2018   LDLCALC 145 (H) 06/29/2018   TRIG 112.0 06/29/2018   CHOLHDL 4 06/29/2018   Lab Results  Component Value Date   ALT 21 06/29/2018   AST 21 06/29/2018   ALKPHOS 72 06/29/2018   BILITOT 0.4 06/29/2018   Lab Results  Component Value Date   CREATININE 0.79 06/29/2018   BUN 20 06/29/2018   NA 140 06/29/2018   K 4.6 06/29/2018   CL 103 06/29/2018   CO2 29 06/29/2018     Medicare annual wellness visit, subsequent We discussed the importance of staying active, physically and mentally, as well as the benefits of a healthy/balnace diet. Low impact exercise that involve stretching and strengthing are ideal. Vaccines updated. We discussed preventive screening for the next 5-10 years, summery of recommendations given in AVS:  Colonoscopy due in 03/2021.  Glaucoma screening/eye exam every 1-2 years.  Mammogram for breast cancer screening annually.  Flu vaccine annually.  Diabetes screening annually.  Fall prevention  Advance directives and end of life discussed, she has POA and living will.   The 10-year ASCVD risk  score Mikey Bussing DC Jr., et al., 2013) is: 7.3%   Values used to calculate the score:  Age: 53 years     Sex: Female     Is Non-Hispanic African American: No     Diabetic: No     Tobacco smoker: No     Systolic Blood Pressure: 485 mmHg     Is BP treated: No     HDL Cholesterol: 63.3 mg/dL     Total Cholesterol: 230 mg/dL  Dermatitis of ear canal, bilateral Chronic problem. It is well controlled with topical steroid,no changes. Some side effects discussed.  -     desonide (DESOWEN) 0.05 % ointment; Apply 1 application topically daily as needed. For up to 2 weeks at the time.  Encounter for immunization -     Flu vaccine HIGH DOSE PF  Depression, major, recurrent, mild ([place]) Well controlled with Cymbalta 60 mg. No changes in current management.   Arthralgia of both lower legs Probably has been well controlled with Cymbalta 60 mg daily. No changes in current management. I think it is appropriate to follow annually, before if needed.  Osteoporosis Planning on starting Actonel tomorrow. Calcium supplementation, ideally through her diet, 1200 mg daily. Continue vitamin D supplementation. Regular physical activity and fall prevention also discussed. Continue following with Dr. Karl Bales  Hyperlipidemia Continue nonpharmacologic treatment. Further recommendation will be given according to lipid panel results as well as 10 years CVD score.  GERD (gastroesophageal reflux disease) Problem is well controlled. No changes in Nexium 40 mg daily. GERD precautions to continue.    Return in about 1 year (around 07/02/2019) for cpe and medicare.          Carlin Attridge G. Martinique, MD  [facility]. [place] office.

## 2018-06-29 NOTE — Assessment & Plan Note (Signed)
Planning on starting Actonel tomorrow. Calcium supplementation, ideally through her diet, 1200 mg daily. Continue vitamin D supplementation. Regular physical activity and fall prevention also discussed. Continue following with Dr. Karl Bales

## 2018-07-02 ENCOUNTER — Encounter: Payer: Self-pay | Admitting: Family Medicine

## 2018-07-02 DIAGNOSIS — M86679 Other chronic osteomyelitis, unspecified ankle and foot: Secondary | ICD-10-CM | POA: Insufficient documentation

## 2018-12-10 ENCOUNTER — Other Ambulatory Visit: Payer: Self-pay | Admitting: Family Medicine

## 2018-12-10 DIAGNOSIS — M25561 Pain in right knee: Secondary | ICD-10-CM

## 2018-12-10 DIAGNOSIS — M25562 Pain in left knee: Secondary | ICD-10-CM

## 2018-12-10 DIAGNOSIS — F330 Major depressive disorder, recurrent, mild: Secondary | ICD-10-CM

## 2018-12-16 ENCOUNTER — Other Ambulatory Visit: Payer: Self-pay | Admitting: Family Medicine

## 2018-12-16 DIAGNOSIS — F330 Major depressive disorder, recurrent, mild: Secondary | ICD-10-CM

## 2018-12-16 DIAGNOSIS — M25561 Pain in right knee: Secondary | ICD-10-CM

## 2019-01-25 ENCOUNTER — Other Ambulatory Visit: Payer: Self-pay | Admitting: Obstetrics and Gynecology

## 2019-01-26 ENCOUNTER — Telehealth: Payer: Self-pay | Admitting: Obstetrics and Gynecology

## 2019-01-26 ENCOUNTER — Encounter: Payer: Self-pay | Admitting: Obstetrics and Gynecology

## 2019-01-26 ENCOUNTER — Other Ambulatory Visit: Payer: Self-pay | Admitting: Obstetrics and Gynecology

## 2019-01-26 DIAGNOSIS — N950 Postmenopausal bleeding: Secondary | ICD-10-CM

## 2019-01-26 NOTE — Telephone Encounter (Signed)
Spoke with patient. Report intermittent spotting for the past two weeks, describes as brown mucous. Postmenopausal. Denies pelvic pain, odor, urinary symptoms, N/V, fever/chills. Recommended OV for further evaluation, OV scheduled for 8/6 at 1pm with Dr. Quincy Simmonds. Covid 19 precautions reviewed, prescreen negative. Advised Dr. Quincy Simmonds will review, our office will return call if any additional recommendations. Patient agreeable.   Routing to Dr. Quincy Simmonds for review.

## 2019-01-26 NOTE — Telephone Encounter (Signed)
Call returned to patient, advised per Dr. Quincy Simmonds. PUS and possible EMB scheduled for 8/6 at 8am, consult to follow at 8:30am with Dr. Quincy Simmonds. OV cancelled.   Routing to provider for final review. Patient is agreeable to disposition. Will close encounter.  Cc: Lerry Liner

## 2019-01-26 NOTE — Telephone Encounter (Signed)
Patient sent the following message through [facility]. Routing to triage to assist patient with request.   I am post menopausal. In the past 2 weeks I have noticed a vaginal discharge that seems intermittent and May have little blood in it. Reminds me of when you are on the last day of your period and the discharge is brownish mucous. I dont know whether to be concerned or not. I dont see it every day. My next appointment is in October. Should I wait till then or should I be seen sooner?  I would appreciate your thoughts. Thanks. Hannah Jennings

## 2019-01-26 NOTE — Telephone Encounter (Addendum)
Medication refill request: actonel  Last AEX:  03/23/18 Dr. Quincy Simmonds  Next AEX: 03/29/19  Last MMG (if hormonal medication request): 06/08/18 BIRADS1:Neg  Refill authorized: today #1tabs/1R. To [facility] [address]  Per pharmacy: patient requesting 90 day supply. Please advise.

## 2019-01-26 NOTE — Telephone Encounter (Signed)
Medication refill request: Actonel  Last AEX:  03-23-18 BS  Next AEX: 03-29-2019 Last BMD: 06-08-18 osteopenia Refill authorized: Please advise.   Medication pended for #1, 1RF. Please refill if appropriate.

## 2019-01-26 NOTE — Telephone Encounter (Signed)
I agree with the appointment.  Please also precert ultrasound and endometrial biopsy for the same day if possible.  [place] Malachi Pro

## 2019-01-28 ENCOUNTER — Other Ambulatory Visit: Payer: Self-pay

## 2019-01-28 ENCOUNTER — Encounter: Payer: Self-pay | Admitting: Obstetrics and Gynecology

## 2019-01-28 ENCOUNTER — Ambulatory Visit (INDEPENDENT_AMBULATORY_CARE_PROVIDER_SITE_OTHER): Payer: Medicare Other | Admitting: Obstetrics and Gynecology

## 2019-01-28 ENCOUNTER — Ambulatory Visit (INDEPENDENT_AMBULATORY_CARE_PROVIDER_SITE_OTHER): Payer: Medicare Other

## 2019-01-28 ENCOUNTER — Ambulatory Visit: Payer: Self-pay | Admitting: Obstetrics and Gynecology

## 2019-01-28 VITALS — BP 126/84 | HR 76 | Temp 97.4°F | Ht 64.0 in | Wt 141.6 lb

## 2019-01-28 DIAGNOSIS — N950 Postmenopausal bleeding: Secondary | ICD-10-CM | POA: Diagnosis not present

## 2019-01-28 NOTE — Progress Notes (Signed)
Encounter reviewed by Dr. Brook Amundson C. Silva.  

## 2019-01-28 NOTE — Patient Instructions (Signed)

## 2019-01-28 NOTE — Progress Notes (Signed)
GYNECOLOGY  VISIT   HPI: 69 y.o.   Married  Caucasian  female   G2P2002 with Patient's last menstrual period was 06/24/2002 (approximate).   here for pelvic ultrasound for postmenopausal bleeding.    States in the last couple of weeks, she noticed discharge.  It looks brownish in color, not bright red.  It almost looked like old blood at the end of a menstrual cycle.  She notices it only in her underwear and not with wiping.   No pain or discomfort in pelvis.   No hormone treatment.  Not using Premarin cream hardly at all.  Not using for the last 3 - 4 months.   GYNECOLOGIC HISTORY: Patient's last menstrual period was 06/24/2002 (approximate). Contraception:  Tubal Menopausal hormone therapy:  none Last mammogram: 06-08-18 Neg/density B/BiRads1 Last pap smear: 03-19-17 Neg,02-01-15 Neg        OB History    Gravida  2   Para  2   Term  2   Preterm      AB      Living  2     SAB      TAB      Ectopic      Multiple      Live Births                 Patient Active Problem List   Diagnosis Date Noted  . Chronic osteomyelitis involving ankle and foot ([place]) 07/02/2018  . GERD (gastroesophageal reflux disease) 06/29/2018  . Dermatitis of ear canal, bilateral 06/13/2016  . Hyperlipidemia 06/13/2016  . Arthralgia of both lower legs 03/18/2016  . Insomnia, unspecified 03/18/2016  . Depression, major, recurrent, mild ([place]) 03/18/2016  . Osteoporosis 01/26/2014    Past Medical History:  Diagnosis Date  . Cancer ([place]) 2018   basal cell--chest and left ear  . Oral lichen planus    --hx of oral--pt. states for years  . Osteoporosis 03-2012   T-score 1.5/2.8     Past Surgical History:  Procedure Laterality Date  . RETINAL TEAR REPAIR CRYOTHERAPY Right   . right foot surgery Right 03/23/2018  . salivatory gland  1990   Blocked salivatory gland removed  . TONSILLECTOMY AND ADENOIDECTOMY  Age 80  . TUBAL LIGATION  1980    Current Outpatient Medications   Medication Sig Dispense Refill  . CALCIUM CITRATE PO Take 1 tablet by mouth daily.    . cholecalciferol (VITAMIN D) 1000 UNITS tablet Take 1,000 Units by mouth daily.     Marland Kitchen desonide (DESOWEN) 0.05 % ointment Apply 1 application topically daily as needed. For up to 2 weeks at the time. 15 g 3  . DULoxetine (CYMBALTA) 60 MG capsule TAKE 1 CAPSULE(60 MG) BY MOUTH DAILY 90 capsule 2  . esomeprazole (NEXIUM) 40 MG capsule Take 40 mg by mouth daily at 12 noon.    Marland Kitchen ibuprofen (ADVIL,MOTRIN) 600 MG tablet Take 600 mg by mouth as needed.     . risedronate (ACTONEL) 150 MG tablet TAKE 1 TABLET BY MOUTH EVERY MONTH WITH WATER ON AN EMPTY STOMACH* DO NOT LIE DOWN FOR NEXT 30 MINUTES AFTER TAKING* 3 tablet 0  . conjugated estrogens (PREMARIN) vaginal cream Use 1/2 gram in vagina 2 - 3 times a week to control symptoms. (Patient not taking: Reported on 01/28/2019) 42.5 g 1   No current facility-administered medications for this visit.      ALLERGIES: Patient has no known allergies.  Family History  Problem Relation Age of  Onset  . Hypertension Mother   . Atrial fibrillation Mother   . Dementia Mother   . Hypertension Father   . Cancer Father        colon  . Neuropathy Sister   . Diabetes Maternal Grandmother   . Cancer Paternal Grandmother     Social History   Socioeconomic History  . Marital status: Married    Spouse name: Not on file  . Number of children: Not on file  . Years of education: Not on file  . Highest education level: Not on file  Occupational History  . Not on file  Social Needs  . Financial resource strain: Not on file  . Food insecurity    Worry: Not on file    Inability: Not on file  . Transportation needs    Medical: Not on file    Non-medical: Not on file  Tobacco Use  . Smoking status: Former Smoker    Quit date: 01/26/1988    Years since quitting: 31.0  . Smokeless tobacco: Never Used  Substance and Sexual Activity  . Alcohol use: Yes    Alcohol/week: 3.0  standard drinks    Types: 3 Glasses of wine per week  . Drug use: No  . Sexual activity: Yes    Partners: Male    Birth control/protection: Surgical    Comment: BTL  Lifestyle  . Physical activity    Days per week: Not on file    Minutes per session: Not on file  . Stress: Not on file  Relationships  . Social Herbalist on phone: Not on file    Gets together: Not on file    Attends religious service: Not on file    Active member of club or organization: Not on file    Attends meetings of clubs or organizations: Not on file    Relationship status: Not on file  . Intimate partner violence    Fear of current or ex partner: Not on file    Emotionally abused: Not on file    Physically abused: Not on file    Forced sexual activity: Not on file  Other Topics Concern  . Not on file  Social History Narrative  . Not on file    ROS:  Pertinent items are noted in HPI.  PHYSICAL EXAMINATION:    BP 126/84   Pulse 76   Temp (!) 97.4 F (36.3 C) (Temporal)   Ht 5\' 4"  (1.626 m)   Wt 141 lb 9.6 oz (64.2 kg)   LMP 06/24/2002 (Approximate)   BMI 24.31 kg/m     General appearance: alert, cooperative and appears stated age    Pelvic: External genitalia:  no lesions              Urethra:  normal appearing urethra with no masses, tenderness or lesions              Bartholins and Skenes: normal                 Vagina: normal appearing vagina with normal color and discharge, no lesions              Cervix: petechiae.               Pap taken: No. Bimanual Exam:  Uterus:  normal size, contour, position, consistency, mobility, non-tender              Adnexa: no mass, fullness, tenderness  US  Uterus no masses.  EMS 1.59 mm.  Sliver of fluid.  Ovaries normal.  No free fluid.    EMB performed due to to ongoing nature of her bleeding.  Consent for procedure. Sterile prep with Hibiclens. Paracervical block 10 cc 1% lidocaine, lot [id number], exp 12/23/19. Tenaculum to  anterior cervical lip.  Pipelle to 5.5 cm x 2.  Tissue to pathology.  Minimal EBL.  No complications.   Chaperone was present for exam.  ASSESSMENT  Postmenopausal bleeding.  Atrophy.   PLAN  Discussion of postmenopausal bleeding and importance of evaluation to rule out malignancy.  Fu EMB.  Instructions and precautions given.  Anticipate restarting Premarin cream.  Keep appointment for annual exam in 2 months.    An After Visit Summary was printed and given to the patient.  _15_____ minutes face to face time of which over 50% was spent in counseling.

## 2019-02-03 ENCOUNTER — Telehealth: Payer: Self-pay

## 2019-02-03 MED ORDER — PREMARIN 0.625 MG/GM VA CREA: TOPICAL_CREAM | VAGINAL | 1 refills | Status: DC

## 2019-02-03 NOTE — Telephone Encounter (Signed)
-----   Message from Nunzio Cobbs, MD sent at 02/01/2019  1:13 PM EDT ----- Please contact patient with results.  She has inactive endometrium and no abnormal cells seen.  I believe her postmenopausal bleeding was due to atrophy.  I am recommending she use Premarin cream 1/2 gram pv at hs nightly for 2 weeks and then reduce to using 1/2 gram pv at hs twice weekly.  Dispense:  30 grams, RF one.  Keep annual exam appointment.

## 2019-02-03 NOTE — Telephone Encounter (Signed)
Spoke with patient. Results given. Patient verbalizes understanding. Rx for Premarin cream 1/2 gram pv at hs nightly for 2 weeks and then reduce to using 1/2 gram pv at hs twice weekly. Dispense:  30 grams, RF one sent to pharmacy on file. Patient is agreeable. Encounter closed.

## 2019-03-08 ENCOUNTER — Other Ambulatory Visit: Payer: Self-pay | Admitting: Obstetrics and Gynecology

## 2019-03-08 NOTE — Telephone Encounter (Signed)
Medication refill request: Actonel Last AEX:  03/23/18 Next AEX: 03/29/19 Last MMG (if hormonal medication request): 06/08/18 Bi-rads 1 neg  Refill authorized: #3 with 0 RF

## 2019-03-24 NOTE — Progress Notes (Signed)
69 y.o. [id number] [name] here for annual exam.    Recent visit for postmenopausal bleeding.  Had US showing thin endometrium with fluid in canal.  EMB benign inactive endometrium.  Using estrogen cream 1/2 gram twice a week and no further bleeding.   PCP: Hannah Martinique, MD  Patient's last menstrual period was 06/24/2002 (approximate).           Sexually active: Yes.    The current method of family planning is tubal ligation.    Exercising: Yes.    walking Smoker:  no  Health Maintenance: Pap: 03-19-17 Neg, 02-01-15 Neg, 01-25-13 Neg:Neg HR HPV History of abnormal Pap:  no MMG:  06-08-18 3D/Neg/density B/BiRads1 Colonoscopy:  2015normal with Dr. Buccini;next due 2020due to family history of colon cancer in father. BMD: 06-08-18 Result :Osteopenia TDaP:  12/2017 Gardasil:   n/a [id number] Hep C:06-25-17 Neg Screening Labs:   PCP.  Flu vaccine:  Done yesterday.    reports that she quit smoking about 31 years ago. She has never used smokeless tobacco. She reports current alcohol use of about 3.0 standard drinks of alcohol per week. She reports that she does not use drugs.  Past Medical History:  Diagnosis Date  . Cancer ([place]) 2018   basal cell--chest and left ear  . Oral lichen planus    --hx of oral--pt. states for years  . Osteoporosis 03-2012   T-score 1.5/2.8     Past Surgical History:  Procedure Laterality Date  . RETINAL TEAR REPAIR CRYOTHERAPY Right   . right foot surgery Right 03/23/2018  . salivatory gland  1990   Blocked salivatory gland removed  . TONSILLECTOMY AND ADENOIDECTOMY  Age 65  . TUBAL LIGATION  1980    Current Outpatient Medications  Medication Sig Dispense Refill  . CALCIUM CITRATE PO Take 1 tablet by mouth daily.    . cholecalciferol (VITAMIN D) 1000 UNITS tablet Take 1,000 Units by mouth daily.     Marland Kitchen conjugated estrogens (PREMARIN) vaginal cream Place 1/2 gram pv at hs nightly for 2 weeks and then reduce to using 1/2 gram pv at hs  twice weekly. 30 g 1  . desonide (DESOWEN) 0.05 % ointment Apply 1 application topically daily as needed. For up to 2 weeks at the time. 15 g 3  . DULoxetine (CYMBALTA) 60 MG capsule TAKE 1 CAPSULE(60 MG) BY MOUTH DAILY 90 capsule 2  . esomeprazole (NEXIUM) 40 MG capsule Take 40 mg by mouth daily at 12 noon.    Marland Kitchen ibuprofen (ADVIL,MOTRIN) 600 MG tablet Take 600 mg by mouth as needed.     . risedronate (ACTONEL) 150 MG tablet TAKE 1 TABLET BY MOUTH EVERY MONTH WITH WATER AND ON AN EMPTY STOMACH. DO NOT LIE DOWN FOR NEXT 30 MINUTES AFTER TAKING* 3 tablet 0   No current facility-administered medications for this visit.     Family History  Problem Relation Age of Onset  . Hypertension Mother   . Atrial fibrillation Mother   . Dementia Mother   . Hypertension Father   . Cancer Father        colon  . Neuropathy Sister   . Diabetes Maternal Grandmother   . Cancer Paternal Grandmother     Review of Systems  All other systems reviewed and are negative.   Exam:   BP 134/76   Pulse 88   Temp (!) 97.5 F (36.4 C) (Temporal)   Resp 14   Ht 5\' 4"  (1.626 m)   Wt 140  lb (63.5 kg)   LMP 06/24/2002 (Approximate)   BMI 24.03 kg/m     General appearance: alert, cooperative and appears stated age Head: normocephalic, without obvious abnormality, atraumatic Neck: no adenopathy, supple, symmetrical, trachea midline and thyroid normal to inspection and palpation Lungs: clear to auscultation bilaterally Breasts: normal appearance, no masses or tenderness, No nipple retraction or dimpling, No nipple discharge or bleeding, No axillary adenopathy Heart: regular rate and rhythm Abdomen: soft, non-tender; no masses, no organomegaly Extremities: extremities normal, atraumatic, no cyanosis or edema Skin: skin color, texture, turgor normal. No rashes or lesions Lymph nodes: cervical, supraclavicular, and axillary nodes normal. Neurologic: grossly normal  Pelvic: External genitalia:  no lesions               No abnormal inguinal nodes palpated.              Urethra:  normal appearing urethra with no masses, tenderness or lesions              Bartholins and Skenes: normal                 Vagina: normal appearing vagina with normal color and discharge, no lesions              Cervix: no lesions              Pap taken: Yes.   Bimanual Exam:  Uterus:  normal size, contour, position, consistency, mobility, non-tender              Adnexa: no mass, fullness, tenderness              Rectal exam: Yes.  .  Confirms.              Anus:  normal sphincter tone, no lesions  Chaperone was present for exam.  Assessment:   Well woman visit with normal exam. Atrophy.  Osteopenia.  Increased risk of fracture by FRAX. On Actonel.  Hx low vit D.  Fh colon cancer.   Plan: Mammogram screening discussed. Self breast awareness reviewed. Pap and HR HPV as above. Guidelines for Calcium, Vitamin D, regular exercise program including cardiovascular and weight bearing exercise. Check vit D level.  Rx for Actonel for one year.  Continue Premarin cream.  Does not need refills.  I discussed potential effect on breast cancer.  Next BMD in 2021. Follow up annually and prn.  After visit summary provided.

## 2019-03-25 ENCOUNTER — Other Ambulatory Visit: Payer: Self-pay

## 2019-03-29 ENCOUNTER — Ambulatory Visit (INDEPENDENT_AMBULATORY_CARE_PROVIDER_SITE_OTHER): Payer: Medicare Other | Admitting: Obstetrics and Gynecology

## 2019-03-29 ENCOUNTER — Encounter: Payer: Self-pay | Admitting: Obstetrics and Gynecology

## 2019-03-29 ENCOUNTER — Other Ambulatory Visit: Payer: Self-pay

## 2019-03-29 ENCOUNTER — Other Ambulatory Visit (HOSPITAL_COMMUNITY)
Admission: RE | Admit: 2019-03-29 | Discharge: 2019-03-29 | Disposition: A | Discharge status: Home / Self Care | Payer: Medicare Other | Source: Ambulatory Visit | Attending: Obstetrics and Gynecology | Admitting: Obstetrics and Gynecology

## 2019-03-29 VITALS — BP 134/76 | HR 88 | Temp 97.5°F | Resp 14 | Ht 64.0 in | Wt 140.0 lb

## 2019-03-29 DIAGNOSIS — Z01419 Encounter for gynecological examination (general) (routine) without abnormal findings: Secondary | ICD-10-CM | POA: Insufficient documentation

## 2019-03-29 MED ORDER — RISEDRONATE SODIUM 150 MG PO TABS: ORAL_TABLET | ORAL | 3 refills | Status: DC

## 2019-03-29 NOTE — Patient Instructions (Signed)

## 2019-03-30 LAB — VITAMIN D 25 HYDROXY (VIT D DEFICIENCY, FRACTURES): Vit D, 25-Hydroxy: 34.3 ng/mL (ref 30.0–100.0)

## 2019-03-31 LAB — CYTOLOGY - PAP
Adequacy: ABSENT
Diagnosis: NEGATIVE

## 2019-06-28 ENCOUNTER — Other Ambulatory Visit: Payer: Self-pay | Admitting: Obstetrics and Gynecology

## 2019-06-28 DIAGNOSIS — Z1231 Encounter for screening mammogram for malignant neoplasm of breast: Secondary | ICD-10-CM

## 2019-07-09 ENCOUNTER — Ambulatory Visit: Payer: Medicare Other

## 2019-07-21 ENCOUNTER — Ambulatory Visit: Payer: Medicare Other

## 2019-07-21 ENCOUNTER — Ambulatory Visit (INDEPENDENT_AMBULATORY_CARE_PROVIDER_SITE_OTHER): Payer: Medicare Other

## 2019-07-21 VITALS — Ht 64.0 in | Wt 140.0 lb

## 2019-07-21 DIAGNOSIS — Z0000 Encounter for general adult medical examination without abnormal findings: Secondary | ICD-10-CM

## 2019-07-21 NOTE — Patient Instructions (Addendum)
Hannah Jennings , Thank you for taking time to participate in your Medicare Wellness Visit. I appreciate your ongoing commitment to your health goals. Please review the following plan we discussed and let me know if I can assist you in the future.   Screening recommendations/referrals: Colorectal Screening: colonoscopy due in 2021 per patient. Dr. Cristina Gong Mammogram: completed 23/16/2019; scheduled in Feb 2021. Bone Density: completed 06/08/2018; due again 06/08/2020.  Vision and Dental Exams: Recommended annual ophthalmology exams for early detection of glaucoma and other disorders of the eye Recommended annual dental exams for proper oral hygiene  Diabetic Exams: Diabetic Eye Exam: N/A Diabetic Foot Exam: N/A  Vaccinations: Influenza vaccine: completed 03/28/2019; due again Fall 2021. Pneumococcal vaccine: completed 03/23/2015 & 06/25/2017. Up to date. Tdap vaccine: completed 12/24/2017; due again 12/25/2027. Shingles vaccine: Please call your pharmacy to determine your out of pocket expense for the Shingrix vaccine. You may receive this vaccine at your local pharmacy. This is a series of two injections to be given 2-6 months apart.  Advanced directives: Advance directives discussed with you today. Please bring a copy of your POA (Power of [place]) and/or Living Will to your next appointment.  Goals: Recommend to drink at least 6-8 8oz glasses of water per day.  Continue to exercise for at least 150 minutes per week.  Recommend to decrease portion sizes by eating 3 small healthy meals and at least 2 healthy snacks per day.   Next appointment: Please schedule your Annual Wellness Visit with your Nurse Health Advisor in one year.  Preventive Care 70 Years and Older, Female Preventive care refers to lifestyle choices and visits with your health care provider that can promote health and wellness. What does preventive care include?  A yearly physical exam. This is also called an annual  well check.  Dental exams once or twice a year.  Routine eye exams. Ask your health care provider how often you should have your eyes checked.  Personal lifestyle choices, including:  Daily care of your teeth and gums.  Regular physical activity.  Eating a healthy diet.  Avoiding tobacco and drug use.  Limiting alcohol use.  Practicing safe sex.  Taking low-dose aspirin every day if recommended by your health care provider.  Taking vitamin and mineral supplements as recommended by your health care provider. What happens during an annual well check? The services and screenings done by your health care provider during your annual well check will depend on your age, overall health, lifestyle risk factors, and family history of disease. Counseling  Your health care provider may ask you questions about your:  Alcohol use.  Tobacco use.  Drug use.  Emotional well-being.  Home and relationship well-being.  Sexual activity.  Eating habits.  History of falls.  Memory and ability to understand (cognition).  Work and work Statistician.  Reproductive health. Screening  You may have the following tests or measurements:  Height, weight, and BMI.  Blood pressure.  Lipid and cholesterol levels. These may be checked every 5 years, or more frequently if you are over 3 years old.  Skin check.  Lung cancer screening. You may have this screening every year starting at age 25 if you have a 30-pack-year history of smoking and currently smoke or have quit within the past 15 years.  Fecal occult blood test (FOBT) of the stool. You may have this test every year starting at age 49.  Flexible sigmoidoscopy or colonoscopy. You may have a sigmoidoscopy every 5 years or a  colonoscopy every 10 years starting at age 90.  Hepatitis C blood test.  Hepatitis B blood test.  Sexually transmitted disease (STD) testing.  Diabetes screening. This is done by checking your blood sugar  (glucose) after you have not eaten for a while (fasting). You may have this done every 1-3 years.  Bone density scan. This is done to screen for osteoporosis. You may have this done starting at age 17.  Mammogram. This may be done every 1-2 years. Talk to your health care provider about how often you should have regular mammograms. Talk with your health care provider about your test results, treatment options, and if necessary, the need for more tests. Vaccines  Your health care provider may recommend certain vaccines, such as:  Influenza vaccine. This is recommended every year.  Tetanus, diphtheria, and acellular pertussis (Tdap, Td) vaccine. You may need a Td booster every 10 years.  Zoster vaccine. You may need this after age 71.  Pneumococcal 13-valent conjugate (PCV13) vaccine. One dose is recommended after age 35.  Pneumococcal polysaccharide (PPSV23) vaccine. One dose is recommended after age 58. Talk to your health care provider about which screenings and vaccines you need and how often you need them. This information is not intended to replace advice given to you by your health care provider. Make sure you discuss any questions you have with your health care provider. Document Released: 07/07/2015 Document Revised: 02/28/2016 Document Reviewed: 04/11/2015 Elsevier Interactive Patient Education  2017 [employer] [employer] Prevention in the Home Falls can cause injuries. They can happen to people of all ages. There are many things you can do to make your home safe and to help prevent falls. What can I do on the outside of my home?  Regularly fix the edges of walkways and driveways and fix any cracks.  Remove anything that might make you trip as you walk through a door, such as a raised step or threshold.  Trim any bushes or trees on the path to your home.  Use bright outdoor lighting.  Clear any walking paths of anything that might make someone trip, such as rocks or  tools.  Regularly check to see if handrails are loose or broken. Make sure that both sides of any steps have handrails.  Any raised decks and porches should have guardrails on the edges.  Have any leaves, snow, or ice cleared regularly.  Use sand or salt on walking paths during winter.  Clean up any spills in your garage right away. This includes oil or grease spills. What can I do in the bathroom?  Use night lights.  Install grab bars by the toilet and in the tub and shower. Do not use towel bars as grab bars.  Use non-skid mats or decals in the tub or shower.  If you need to sit down in the shower, use a plastic, non-slip stool.  Keep the floor dry. Clean up any water that spills on the floor as soon as it happens.  Remove soap buildup in the tub or shower regularly.  Attach bath mats securely with double-sided non-slip rug tape.  Do not have throw rugs and other things on the floor that can make you trip. What can I do in the bedroom?  Use night lights.  Make sure that you have a light by your bed that is easy to reach.  Do not use any sheets or blankets that are too big for your bed. They should not hang down onto the  floor.  Have a firm chair that has side arms. You can use this for support while you get dressed.  Do not have throw rugs and other things on the floor that can make you trip. What can I do in the kitchen?  Clean up any spills right away.  Avoid walking on wet floors.  Keep items that you use a lot in easy-to-reach places.  If you need to reach something above you, use a strong step stool that has a grab bar.  Keep electrical cords out of the way.  Do not use floor polish or wax that makes floors slippery. If you must use wax, use non-skid floor wax.  Do not have throw rugs and other things on the floor that can make you trip. What can I do with my stairs?  Do not leave any items on the stairs.  Make sure that there are handrails on both  sides of the stairs and use them. Fix handrails that are broken or loose. Make sure that handrails are as long as the stairways.  Check any carpeting to make sure that it is firmly attached to the stairs. Fix any carpet that is loose or worn.  Avoid having throw rugs at the top or bottom of the stairs. If you do have throw rugs, attach them to the floor with carpet tape.  Make sure that you have a light switch at the top of the stairs and the bottom of the stairs. If you do not have them, ask someone to add them for you. What else can I do to help prevent falls?  Wear shoes that:  Do not have high heels.  Have rubber bottoms.  Are comfortable and fit you well.  Are closed at the toe. Do not wear sandals.  If you use a stepladder:  Make sure that it is fully opened. Do not climb a closed stepladder.  Make sure that both sides of the stepladder are locked into place.  Ask someone to hold it for you, if possible.  Clearly mark and make sure that you can see:  Any grab bars or handrails.  First and last steps.  Where the edge of each step is.  Use tools that help you move around (mobility aids) if they are needed. These include:  Canes.  Walkers.  Scooters.  Crutches.  Turn on the lights when you go into a dark area. Replace any light bulbs as soon as they burn out.  Set up your furniture so you have a clear path. Avoid moving your furniture around.  If any of your floors are uneven, fix them.  If there are any pets around you, be aware of where they are.  Review your medicines with your doctor. Some medicines can make you feel dizzy. This can increase your chance of falling. Ask your doctor what other things that you can do to help prevent falls. This information is not intended to replace advice given to you by your health care provider. Make sure you discuss any questions you have with your health care provider. Document Released: 04/06/2009 Document Revised:  11/16/2015 Document Reviewed: 07/15/2014 Elsevier Interactive Patient Education  2017 [employer].

## 2019-07-21 NOTE — Progress Notes (Signed)
This visit is being conducted via phone call due to the COVID-19 pandemic. This patient has given me verbal consent via phone to conduct this visit, patient states they are participating from their home address. Some vital signs may be absent or patient reported.   Patient identification: identified by name, DOB, and current address.  Location provider: [facility], Office Persons participating in the virtual visit: Mrs. Hannah Jennings and Franne Forts, LPN.    Subjective:   Hannah Jennings is a 69 y.o. female who presents for Medicare Annual (Subsequent) preventive examination.  Hannah Jennings is currently doing well. She walks several times daily outside with 3 dogs. She is staying busy helping with 2 grand-daughters.  Review of Systems:  No ROS; Annual Medicare Wellness Visit  Cardiac Risk Factors include: dyslipidemia;advanced age (>83men, >54 women)    Objective:    Vitals: Ht 5\' 4"  (1.626 m)   Wt 140 lb (63.5 kg)   LMP 06/24/2002 (Approximate)   BMI 24.03 kg/m   Body mass index is 24.03 kg/m.  Advanced Directives 07/21/2019  Does Patient Have a Medical Advance Directive? Yes  Type of Paramedic of [place];Living will  Does patient want to make changes to medical advance directive? No - Patient declined  Copy of [place] in Chart? No - copy requested    Tobacco Social History   Tobacco Use  Smoking Status Former Smoker  . Quit date: 01/26/1988  . Years since quitting: 31.5  Smokeless Tobacco Never Used     Counseling given: Not Answered   Clinical Intake:  Pre-visit preparation completed: Yes  Pain : No/denies pain     BMI - recorded: 24.03 Nutritional Status: BMI of 19-24  Normal Nutritional Risks: None Diabetes: No  How often do you need to have someone help you when you read instructions, pamphlets, or other written materials from your doctor or pharmacy?: 1 - Never What is the last grade level you  completed in school?: 4 years nursing  Interpreter Needed?: No  Information entered by :: Franne Forts, LPN.  Past Medical History:  Diagnosis Date  . Cancer ([place]) 2018   basal cell--chest and left ear  . Oral lichen planus    --hx of oral--pt. states for years  . Osteoporosis 03-2012   T-score 1.5/2.8    Past Surgical History:  Procedure Laterality Date  . RETINAL TEAR REPAIR CRYOTHERAPY Right   . right foot surgery Right 03/23/2018  . salivatory gland  1990   Blocked salivatory gland removed  . TONSILLECTOMY AND ADENOIDECTOMY  Age 32  . TUBAL LIGATION  1980   Family History  Problem Relation Age of Onset  . Hypertension Mother   . Atrial fibrillation Mother   . Dementia Mother   . Hypertension Father   . Cancer Father        colon  . Neuropathy Sister   . Diabetes Maternal Grandmother   . Cancer Paternal Grandmother    Social History   Socioeconomic History  . Marital status: Married    Spouse name: Not on file  . Number of children: 2  . Years of education: 16  . Highest education level: Bachelor's degree (e.g., BA, AB, BS)  Occupational History  . Occupation: retired  Tobacco Use  . Smoking status: Former Smoker    Quit date: 01/26/1988    Years since quitting: 31.5  . Smokeless tobacco: Never Used  Substance and Sexual Activity  . Alcohol use: Yes  Alcohol/week: 1.0 standard drinks    Types: 1 Glasses of wine per week    Comment: 4-5 per week  . Drug use: No  . Sexual activity: Yes    Partners: Male    Birth control/protection: Surgical    Comment: BTL  Other Topics Concern  . Not on file  Social History Narrative   [place] 3   3 dogs   Retired Copywriter, advertising   2 grandchildren   Social Determinants of Radio broadcast assistant Strain: Low Risk   . Difficulty of Paying Living Expenses: Not hard at all  Food Insecurity: No Food Insecurity  . Worried About Charity fundraiser in the Last Year: Never true  . Ran Out of Food in the Last Year: Never true   Transportation Needs: No Transportation Needs  . Lack of Transportation (Medical): No  . Lack of Transportation (Non-Medical): No  Physical Activity: Sufficiently Active  . Days of Exercise per Week: 7 days  . Minutes of Exercise per Session: 60 min  Stress: No Stress Concern Present  . Feeling of Stress : Not at all  Social Connections: Somewhat Isolated  . Frequency of Communication with Friends and Family: More than three times a week  . Frequency of Social Gatherings with Friends and Family: Twice a week  . Attends Religious Services: Never  . Active Member of Clubs or Organizations: No  . Attends Archivist Meetings: Never  . Marital Status: Married    Outpatient Encounter Medications as of 07/21/2019  Medication Sig  . CALCIUM CITRATE PO Take 1 tablet by mouth daily.  . cholecalciferol (VITAMIN D) 1000 UNITS tablet Take 1,000 Units by mouth daily.   . cimetidine (TAGAMET) 200 MG tablet Take 200 mg by mouth as needed.  . conjugated estrogens (PREMARIN) vaginal cream Place 1/2 gram pv at hs nightly for 2 weeks and then reduce to using 1/2 gram pv at hs twice weekly.  Marland Kitchen desonide (DESOWEN) 0.05 % ointment Apply 1 application topically daily as needed. For up to 2 weeks at the time.  . DULoxetine (CYMBALTA) 60 MG capsule TAKE 1 CAPSULE(60 MG) BY MOUTH DAILY  . ibuprofen (ADVIL,MOTRIN) 600 MG tablet Take 600 mg by mouth as needed.   . risedronate (ACTONEL) 150 MG tablet TAKE 1 TABLET BY MOUTH EVERY MONTH WITH WATER AND ON AN EMPTY STOMACH. DO NOT LIE DOWN FOR NEXT 30 MINUTES AFTER TAKING*  . esomeprazole (NEXIUM) 40 MG capsule Take 40 mg by mouth daily at 12 noon.  . Influenza Virus Vacc Split PF 0.5 ML SUSY Afluria 2014-2015(PF) 45 mcg (15 mcg x 3)/0.5 mL intramuscular syringe  inject 0.5 milliliter intramuscularly   No facility-administered encounter medications on file as of 07/21/2019.    Activities of Daily Living In your present state of health, do you have any  difficulty performing the following activities: 07/21/2019  Hearing? N  Vision? N  Difficulty concentrating or making decisions? N  Walking or climbing stairs? N  Dressing or bathing? N  Doing errands, shopping? N  Preparing Food and eating ? N  Using the Toilet? N  In the past six months, have you accidently leaked urine? N  Do you have problems with loss of bowel control? N  Managing your Medications? N  Managing your Finances? N  Housekeeping or managing your Housekeeping? N  Some recent data might be hidden    Patient Care Team: Martinique, Betty G, MD as PCP - General (Family Medicine)  Assessment:   This is a routine wellness examination for Reganne.  Exercise Activities and Dietary recommendations Current Exercise Habits: Home exercise routine, Type of exercise: walking, Time (Minutes): 40, Frequency (Times/Week): 7, Weekly Exercise (Minutes/Week): 280, Intensity: Moderate, Exercise limited by: None identified  Goals    . Patient Stated     Lose weight       Fall Risk Fall Risk  07/21/2019 06/25/2017  Falls in the past year? 0 No  Risk for fall due to : Medication side effect -  Follow up Falls evaluation completed;Education provided;Falls prevention discussed -   Is the patient's home free of loose throw rugs in walkways, pet beds, electrical cords, etc?   yes      Grab bars in the bathroom? yes      Handrails on the stairs?   yes      Adequate lighting?   yes  Timed Get Up and Go performed: N/A due to telephone visit.  Depression Screen PHQ 2/9 Scores 07/21/2019 06/25/2017  PHQ - 2 Score 0 0     Cognitive Function     6CIT Screen 07/21/2019  What Year? 0 points  What month? 0 points  What time? 0 points  Count back from 20 0 points  Months in reverse 0 points  Repeat phrase 0 points  Total Score 0    Immunization History  Administered Date(s) Administered  . Influenza, High Dose Seasonal PF 03/18/2016, 06/03/2017, 06/29/2018, 03/28/2019  . Pneumococcal  Polysaccharide-23 06/25/2017  . Tdap 12/08/2007, 12/24/2017    Qualifies for Shingles Vaccine?yes  Screening Tests Health Maintenance  Topic Date Due  . MAMMOGRAM  06/08/2020  . COLONOSCOPY  04/03/2026  . TETANUS/TDAP  12/25/2027  . INFLUENZA VACCINE  Completed  . DEXA SCAN  Completed  . Hepatitis C Screening  Completed  . PNA vac Low Risk Adult  Completed    Cancer Screenings: Lung: Low Dose CT Chest recommended if Age 7-80 years, 30 pack-year currently smoking OR have quit w/in 15years. Patient does not qualify. Breast:  Up to date on Mammogram? Yes   Up to date of Bone Density/Dexa? Yes Colorectal: yes  Additional Screenings:  Hepatitis C Screening: completed 06/25/2017.    Plan:   Mrs. Mcmenemy states she is due for colonoscopy later this year and receives a letter from Dr. Cristina Gong when it's time to schedule. She plans to have the covid vaccines when they become available to her and will then have shingrix vaccines. She is also scheduled to have a mammogram in Feb 2021. All other preventive health screenings and immunizations are up to date.   I have personally reviewed and noted the following in the patient's chart:   . Medical and social history . Use of alcohol, tobacco or illicit drugs  . Current medications and supplements . Functional ability and status . Nutritional status . Physical activity . Advanced directives . List of other physicians . Hospitalizations, surgeries, and ER visits in previous 12 months . Vitals . Screenings to include cognitive, depression, and falls . Referrals and appointments  In addition, I have reviewed and discussed with patient certain preventive protocols, quality metrics, and best practice recommendations. A written personalized care plan for preventive services as well as general preventive health recommendations were provided to patient.     Franne Forts, LPN  [id number]

## 2019-08-06 ENCOUNTER — Other Ambulatory Visit: Payer: Self-pay

## 2019-08-06 ENCOUNTER — Ambulatory Visit
Admission: RE | Admit: 2019-08-06 | Discharge: 2019-08-06 | Disposition: A | Discharge status: Home / Self Care | Payer: Medicare Other | Source: Ambulatory Visit | Attending: Obstetrics and Gynecology | Admitting: Obstetrics and Gynecology

## 2019-08-06 DIAGNOSIS — Z1231 Encounter for screening mammogram for malignant neoplasm of breast: Secondary | ICD-10-CM

## 2019-08-28 ENCOUNTER — Encounter: Payer: Self-pay | Admitting: Family Medicine

## 2019-09-01 ENCOUNTER — Other Ambulatory Visit: Payer: Self-pay

## 2019-09-01 ENCOUNTER — Ambulatory Visit (INDEPENDENT_AMBULATORY_CARE_PROVIDER_SITE_OTHER): Payer: Medicare Other | Admitting: Family Medicine

## 2019-09-01 ENCOUNTER — Encounter: Payer: Self-pay | Admitting: Family Medicine

## 2019-09-01 VITALS — BP 120/68 | HR 103 | Resp 12 | Ht 64.0 in | Wt 143.4 lb

## 2019-09-01 DIAGNOSIS — R030 Elevated blood-pressure reading, without diagnosis of hypertension: Secondary | ICD-10-CM

## 2019-09-01 NOTE — Progress Notes (Signed)
ACUTE VISIT   HPI:  Chief Complaint  Patient presents with  . Follow-up    elevated BP readings on home monitor    Hannah Jennings is a 69 y.o. female, who is here today with above concern. No known hx of HTN. Her BP's here during OV's has been adequate.  She has been under emotional stress, her son died recently.  She found her son's BP monitor late 07/2019 and started checking her BP. 164/88,174/102.  Negative for severe/frequent headache, visual changes, chest pain, dyspnea, palpitation, focal weakness, or edema.  She does not take NSAID's frequently. Negative for new dietary supplements or herbal remedies.  Lab Results  Component Value Date   CREATININE 0.79 06/29/2018   BUN 20 06/29/2018   NA 140 06/29/2018   K 4.6 06/29/2018   CL 103 06/29/2018   CO2 29 06/29/2018   In general she follows a healthful diet. She brought BP monitor today.  Hx of depression, she is on Cymbalta 60 mg daily to also treat LE pain.  Review of Systems  Constitutional: Positive for fatigue. Negative for activity change, appetite change and fever.  HENT: Negative for mouth sores, nosebleeds and trouble swallowing.   Respiratory: Negative for cough and wheezing.   Gastrointestinal: Negative for abdominal pain, nausea and vomiting.       Negative for changes in bowel habits.  Genitourinary: Negative for decreased urine volume, difficulty urinating, dysuria and hematuria.  Neurological: Negative for syncope and facial asymmetry.  Psychiatric/Behavioral: Negative for confusion.  Rest see pertinent positives and negatives per HPI.  Current Outpatient Medications on File Prior to Visit  Medication Sig Dispense Refill  . CALCIUM CITRATE PO Take 1 tablet by mouth daily.    . cholecalciferol (VITAMIN D) 1000 UNITS tablet Take 1,000 Units by mouth daily.     . cimetidine (TAGAMET) 200 MG tablet Take 200 mg by mouth as needed.    . conjugated estrogens (PREMARIN) vaginal cream  Place 1/2 gram pv at hs nightly for 2 weeks and then reduce to using 1/2 gram pv at hs twice weekly. 30 g 1  . desonide (DESOWEN) 0.05 % ointment Apply 1 application topically daily as needed. For up to 2 weeks at the time. 15 g 3  . DULoxetine (CYMBALTA) 60 MG capsule TAKE 1 CAPSULE(60 MG) BY MOUTH DAILY 90 capsule 2  . esomeprazole (NEXIUM) 40 MG capsule Take 40 mg by mouth daily at 12 noon.    Marland Kitchen ibuprofen (ADVIL,MOTRIN) 600 MG tablet Take 600 mg by mouth as needed.     . Influenza Virus Vacc Split PF 0.5 ML SUSY Afluria 2014-2015(PF) 45 mcg (15 mcg x 3)/0.5 mL intramuscular syringe  inject 0.5 milliliter intramuscularly    . risedronate (ACTONEL) 150 MG tablet TAKE 1 TABLET BY MOUTH EVERY MONTH WITH WATER AND ON AN EMPTY STOMACH. DO NOT LIE DOWN FOR NEXT 30 MINUTES AFTER TAKING* 3 tablet 3   No current facility-administered medications on file prior to visit.   Past Medical History:  Diagnosis Date  . Cancer ([place]) 2018   basal cell--chest and left ear  . Oral lichen planus    --hx of oral--pt. states for years  . Osteoporosis 03-2012   T-score 1.5/2.8    No Known Allergies  Social History   Socioeconomic History  . Marital status: Married    Spouse name: Not on file  . Number of children: 2  . Years of education: 35  . Highest education  level: Bachelor's degree (e.g., BA, AB, BS)  Occupational History  . Occupation: retired  Tobacco Use  . Smoking status: Former Smoker    Quit date: 01/26/1988    Years since quitting: 31.6  . Smokeless tobacco: Never Used  Substance and Sexual Activity  . Alcohol use: Yes    Alcohol/week: 1.0 standard drinks    Types: 1 Glasses of wine per week    Comment: 4-5 per week  . Drug use: No  . Sexual activity: Yes    Partners: Male    Birth control/protection: Surgical    Comment: BTL  Other Topics Concern  . Not on file  Social History Narrative   [place] 3   3 dogs   Retired Copywriter, advertising   2 grandchildren   Social Determinants of Adult nurse Strain: Low Risk   . Difficulty of Paying Living Expenses: Not hard at all  Food Insecurity: No Food Insecurity  . Worried About Charity fundraiser in the Last Year: Never true  . Ran Out of Food in the Last Year: Never true  Transportation Needs: No Transportation Needs  . Lack of Transportation (Medical): No  . Lack of Transportation (Non-Medical): No  Physical Activity: Sufficiently Active  . Days of Exercise per Week: 7 days  . Minutes of Exercise per Session: 60 min  Stress: No Stress Concern Present  . Feeling of Stress : Not at all  Social Connections: Somewhat Isolated  . Frequency of Communication with Friends and Family: More than three times a week  . Frequency of Social Gatherings with Friends and Family: Twice a week  . Attends Religious Services: Never  . Active Member of Clubs or Organizations: No  . Attends Archivist Meetings: Never  . Marital Status: Married    Vitals:   09/01/19 1611  BP: 120/68  Pulse: (!) 103  Resp: 12  SpO2: 97%   Body mass index is 24.61 kg/m.  Physical Exam  Nursing note and vitals reviewed. Constitutional: She is oriented to person, place, and time. She appears well-developed and well-nourished. No distress.  HENT:  Head: Normocephalic and atraumatic.  Eyes: Pupils are equal, round, and reactive to light. Conjunctivae are normal.  Cardiovascular: Regular rhythm. Tachycardia present.  No murmur heard. Pulses:      Dorsalis pedis pulses are 2+ on the right side and 2+ on the left side.  Respiratory: Effort normal and breath sounds normal. No respiratory distress.  GI: Soft. She exhibits no mass. There is no hepatomegaly. There is no abdominal tenderness.  Musculoskeletal:        General: No edema.  Lymphadenopathy:    She has no cervical adenopathy.  Neurological: She is alert and oriented to person, place, and time. She has normal strength. Gait normal.  Skin: Skin is warm. No rash noted. No  erythema.  Psychiatric: She has a normal mood and affect.  Well groomed, good eye contact.   ASSESSMENT AND PLAN:  [name] was seen today for follow-up.  Diagnoses and all orders for this visit:  Elevated blood pressure reading   Mildly elevated here in the office. We discussed Dx criteria for HTN and appropriate technique to check BP. Continue low salt deit. Avoid frequent use of NSAID's.  BP with her BP monitor:139/82 Re-checked manually:140/78.  Instructed to continue checking BP daily at home and to let me know about BP readings in 2-3 weeks. Instructed about warning signs.  Return if symptoms worsen or fail  to improve, for Depending of BP readings. .   Hannah Santellan G. Martinique, MD  [facility]. [place] office.

## 2019-09-01 NOTE — Patient Instructions (Signed)
A few things to remember from today's visit:   Elevated blood pressure reading  Please let me know about blood pressure readings in 3 weeks.  Goal under 140/90.  How to Take Your Blood Pressure You can take your blood pressure at home with a machine. You may need to check your blood pressure at home:  To check if you have high blood pressure (hypertension).  To check your blood pressure over time.  To make sure your blood pressure medicine is working. Supplies needed: You will need a blood pressure machine, or monitor. You can buy one at a drugstore or online. When choosing one:  Choose one with an arm cuff.  Choose one that wraps around your upper arm. Only one finger should fit between your arm and the cuff.  Do not choose one that measures your blood pressure from your wrist or finger. Your doctor can suggest a monitor. How to prepare Avoid these things for 30 minutes before checking your blood pressure:  Drinking caffeine.  Drinking alcohol.  Eating.  Smoking.  Exercising. Five minutes before checking your blood pressure:  Pee.  Sit in a dining chair. Avoid sitting in a soft couch or armchair.  Be quiet. Do not talk. How to take your blood pressure Follow the instructions that came with your machine. If you have a digital blood pressure monitor, these may be the instructions: 1. Sit up straight. 2. Place your feet on the floor. Do not cross your ankles or legs. 3. Rest your left arm at the level of your heart. You may rest it on a table, desk, or chair. 4. Pull up your shirt sleeve. 5. Wrap the blood pressure cuff around the upper part of your left arm. The cuff should be 1 inch (2.5 cm) above your elbow. It is best to wrap the cuff around bare skin. 6. Fit the cuff snugly around your arm. You should be able to place only one finger between the cuff and your arm. 7. Put the cord inside the groove of your elbow. 8. Press the power button. 9. Sit quietly while  the cuff fills with air and loses air. 10. Write down the numbers on the screen. 11. Wait 2-3 minutes and then repeat steps 1-10. What do the numbers mean? Two numbers make up your blood pressure. The first number is called systolic pressure. The second is called diastolic pressure. An example of a blood pressure reading is "120 over 80" (or 120/80). If you are an adult and do not have a medical condition, use this guide to find out if your blood pressure is normal: Normal  First number: below 120.  Second number: below 80. Elevated  First number: 120-129.  Second number: below 80. Hypertension stage 1  First number: 130-139.  Second number: 80-89. Hypertension stage 2  First number: 140 or above.  Second number: 47 or above. Your blood pressure is above normal even if only the top or bottom number is above normal. Follow these instructions at home:  Check your blood pressure as often as your doctor tells you to.  Take your monitor to your next doctor's appointment. Your doctor will: ? Make sure you are using it correctly. ? Make sure it is working right.  Make sure you understand what your blood pressure numbers should be.  Tell your doctor if your medicines are causing side effects. Contact a doctor if:  Your blood pressure keeps being high. Get help right away if:  Your first  blood pressure number is higher than 180.  Your second blood pressure number is higher than 120. This information is not intended to replace advice given to you by your health care provider. Make sure you discuss any questions you have with your health care provider. Document Revised: 05/23/2017 Document Reviewed: 11/17/2015 Elsevier Patient Education  [place].  Please be sure medication list is accurate. If a new problem present, please set up appointment sooner than planned today.

## 2019-09-01 NOTE — Telephone Encounter (Signed)
I left patient a voicemail to call the office back to schedule an appointment. Okay to work in at 4:30pm today.

## 2019-09-02 ENCOUNTER — Encounter: Payer: Self-pay | Admitting: Family Medicine

## 2019-09-04 ENCOUNTER — Other Ambulatory Visit: Payer: Self-pay | Admitting: Family Medicine

## 2019-09-04 DIAGNOSIS — M25562 Pain in left knee: Secondary | ICD-10-CM

## 2019-09-04 DIAGNOSIS — F330 Major depressive disorder, recurrent, mild: Secondary | ICD-10-CM

## 2019-09-04 DIAGNOSIS — M25561 Pain in right knee: Secondary | ICD-10-CM

## 2020-02-11 ENCOUNTER — Other Ambulatory Visit: Payer: Self-pay | Admitting: Obstetrics and Gynecology

## 2020-02-11 DIAGNOSIS — Z780 Asymptomatic menopausal state: Secondary | ICD-10-CM

## 2020-02-11 DIAGNOSIS — N950 Postmenopausal bleeding: Secondary | ICD-10-CM

## 2020-02-11 NOTE — Telephone Encounter (Signed)
Medication refill request: Premarin Cream 30g Last AEX:  03/29/19 Next AEX: 03/30/20 Last MMG (if hormonal medication request): 08/06/19  Normal  Refill authorized: 30g/1

## 2020-03-30 ENCOUNTER — Other Ambulatory Visit: Payer: Self-pay

## 2020-03-30 ENCOUNTER — Ambulatory Visit (INDEPENDENT_AMBULATORY_CARE_PROVIDER_SITE_OTHER): Payer: Medicare Other | Admitting: Obstetrics and Gynecology

## 2020-03-30 ENCOUNTER — Encounter: Payer: Self-pay | Admitting: Obstetrics and Gynecology

## 2020-03-30 VITALS — BP 128/70 | HR 84 | Resp 14 | Ht 64.0 in | Wt 140.0 lb

## 2020-03-30 DIAGNOSIS — Z780 Asymptomatic menopausal state: Secondary | ICD-10-CM | POA: Diagnosis not present

## 2020-03-30 DIAGNOSIS — Z01419 Encounter for gynecological examination (general) (routine) without abnormal findings: Secondary | ICD-10-CM

## 2020-03-30 MED ORDER — PREMARIN 0.625 MG/GM VA CREA: TOPICAL_CREAM | VAGINAL | 1 refills | Status: DC

## 2020-03-30 MED ORDER — RISEDRONATE SODIUM 150 MG PO TABS: ORAL_TABLET | ORAL | 3 refills | Status: DC

## 2020-03-30 NOTE — Patient Instructions (Signed)

## 2020-03-30 NOTE — Progress Notes (Signed)
69 y.o. G4P2002 [name] here for annual exam.    No health changes.   Stopped calcium due to constipation and acid.   She is back on Actonel and doing well.  Cymbalta is helping in her leg pain.  Using Premarin cream.   Moved recently.   Completed Moderna Covid vaccine.  Flu vaccine not completed yet.   PCP:  Betty Martinique, MD   Patient's last menstrual period was 06/24/2002 (approximate).           Sexually active: Yes.    The current method of family planning is tubal ligation.    Exercising: Yes.    walking Smoker:  no  Health Maintenance: Pap:  03-29-19 Neg, 03-19-17 Neg, 02-01-15 Neg, 01-25-13 Neg:Neg HR HPV History of abnormal Pap:  no MMG: 08-06-19  3D/Neg/density B/birads1 Colonoscopy: 2016normal with Dr. Fredia Beets due 2022due to family history of colon cancer in father   BMD: 06-08-18  Result :Osteopenia TDaP:  12/2017 Gardasil:   no YDX:AJOIN Hep C:06-25-17 Neg Screening Labs:  PCP.  Shingrix:  Not completed.   reports that she quit smoking about 32 years ago. She has never used smokeless tobacco. She reports current alcohol use of about 1.0 standard drink of alcohol per week. She reports that she does not use drugs.  Past Medical History:  Diagnosis Date  . Cancer ([place]) 2018   basal cell--chest and left ear  . Oral lichen planus    --hx of oral--pt. states for years  . Osteoporosis 03-2012   T-score 1.5/2.8     Past Surgical History:  Procedure Laterality Date  . RETINAL TEAR REPAIR CRYOTHERAPY Right   . right foot surgery Right 03/23/2018  . salivatory gland  1990   Blocked salivatory gland removed  . TONSILLECTOMY AND ADENOIDECTOMY  Age 1  . TUBAL LIGATION  1980    Current Outpatient Medications  Medication Sig Dispense Refill  . cholecalciferol (VITAMIN D) 1000 UNITS tablet Take 1,000 Units by mouth daily.     Marland Kitchen conjugated estrogens (PREMARIN) vaginal cream INSERT 1/2 GRAM IN THE VAGINA AT BEDTIME 2 TIMES A WEEK 30 g 0  .  DULoxetine (CYMBALTA) 60 MG capsule TAKE 1 CAPSULE(60 MG) BY MOUTH DAILY 90 capsule 3  . risedronate (ACTONEL) 150 MG tablet TAKE 1 TABLET BY MOUTH EVERY MONTH WITH WATER AND ON AN EMPTY STOMACH. DO NOT LIE DOWN FOR NEXT 30 MINUTES AFTER TAKING* 3 tablet 3   No current facility-administered medications for this visit.    Family History  Problem Relation Age of Onset  . Hypertension Mother   . Atrial fibrillation Mother   . Dementia Mother   . Hypertension Father   . Cancer Father        colon  . Neuropathy Sister   . Diabetes Maternal Grandmother   . Cancer Paternal Grandmother     Review of Systems  All other systems reviewed and are negative.   Exam:   BP 128/70   Pulse 84   Resp 14   Ht 5\' 4"  (1.626 m)   Wt 140 lb (63.5 kg)   LMP 06/24/2002 (Approximate)   BMI 24.03 kg/m     General appearance: alert, cooperative and appears stated age Head: normocephalic, without obvious abnormality, atraumatic Neck: no adenopathy, supple, symmetrical, trachea midline and thyroid normal to inspection and palpation Lungs: clear to auscultation bilaterally Breasts: normal appearance, no masses or tenderness, No nipple retraction or dimpling, No nipple discharge or bleeding, No axillary adenopathy Heart: regular  rate and rhythm Abdomen: soft, non-tender; no masses, no organomegaly Extremities: extremities normal, atraumatic, no cyanosis or edema Skin: skin color, texture, turgor normal. No rashes or lesions Lymph nodes: cervical, supraclavicular, and axillary nodes normal. Neurologic: grossly normal  Pelvic: External genitalia:  no lesions              No abnormal inguinal nodes palpated.              Urethra:  normal appearing urethra with no masses, tenderness or lesions              Bartholins and Skenes: normal                 Vagina: normal appearing vagina with normal color and discharge, no lesions              Cervix: no lesions              Pap taken: No. Bimanual Exam:   Uterus:  normal size, contour, position, consistency, mobility, non-tender              Adnexa: no mass, fullness, tenderness              Rectal exam: Yes.  .  Confirms.              Anus:  normal sphincter tone, no lesions  Chaperone was present for exam.  Assessment:   Well woman visit with normal exam. Osteopenia.  Leg pain with Actonel.  Hx vit D deficiency.  Atrophy of vagina. FH colon cancer.   Plan: Mammogram screening discussed. Self breast awareness reviewed. Pap and HR HPV as above. Guidelines for Calcium, Vitamin D, regular exercise program including cardiovascular and weight bearing exercise. BMD. Refill of Actonel.  Refill of Premarin cream.  I discussed potential effect on breast cancer. Follow up annually and prn.   After visit summary provided.

## 2020-06-13 ENCOUNTER — Other Ambulatory Visit: Payer: Self-pay | Admitting: Obstetrics and Gynecology

## 2020-06-13 DIAGNOSIS — Z780 Asymptomatic menopausal state: Secondary | ICD-10-CM

## 2020-06-13 DIAGNOSIS — Z8739 Personal history of other diseases of the musculoskeletal system and connective tissue: Secondary | ICD-10-CM

## 2020-06-13 DIAGNOSIS — Z7983 Long term (current) use of bisphosphonates: Secondary | ICD-10-CM

## 2020-06-15 ENCOUNTER — Other Ambulatory Visit: Payer: Self-pay | Admitting: Obstetrics and Gynecology

## 2020-06-15 DIAGNOSIS — Z1231 Encounter for screening mammogram for malignant neoplasm of breast: Secondary | ICD-10-CM

## 2020-07-28 ENCOUNTER — Ambulatory Visit: Payer: Medicare Other

## 2020-08-01 ENCOUNTER — Other Ambulatory Visit: Payer: Self-pay | Admitting: Family Medicine

## 2020-08-01 DIAGNOSIS — H60543 Acute eczematoid otitis externa, bilateral: Secondary | ICD-10-CM

## 2020-08-11 NOTE — Progress Notes (Signed)
Subjective:   Hannah Jennings is a 70 y.o. female who presents for Medicare Annual (Subsequent) preventive examination.  Review of Systems    N/A  Cardiac Risk Factors include: advanced age (>4men, >39 women)     Objective:    Today's Vitals   08/14/20 0835  BP: 130/80  Pulse: 82  Temp: 98.2 F (36.8 C)  TempSrc: Oral  SpO2: 93%  Weight: 143 lb 9 oz (65.1 kg)  Height: 5\' 4"  (1.626 m)   Body mass index is 24.64 kg/m.  Advanced Directives 08/14/2020 07/21/2019  Does Patient Have a Medical Advance Directive? Yes Yes  Type of Paramedic of [place];Living will [place];Living will  Does patient want to make changes to medical advance directive? No - Patient declined No - Patient declined  Copy of [place] in Chart? No - copy requested No - copy requested    Current Medications (verified) Outpatient Encounter Medications as of 08/14/2020  Medication Sig  . cholecalciferol (VITAMIN D) 1000 UNITS tablet Take 1,000 Units by mouth daily.   Marland Kitchen conjugated estrogens (PREMARIN) vaginal cream INSERT 1/2 GRAM IN THE VAGINA AT BEDTIME 2 TIMES A WEEK  . DULoxetine (CYMBALTA) 60 MG capsule TAKE 1 CAPSULE(60 MG) BY MOUTH DAILY  . risedronate (ACTONEL) 150 MG tablet TAKE 1 TABLET BY MOUTH EVERY MONTH WITH WATER AND ON AN EMPTY STOMACH. DO NOT LIE DOWN FOR NEXT 30 MINUTES AFTER TAKING*   No facility-administered encounter medications on file as of 08/14/2020.    Allergies (verified) Patient has no known allergies.   History: Past Medical History:  Diagnosis Date  . Cancer ([place]) 2018   basal cell--chest and left ear  . Oral lichen planus    --hx of oral--pt. states for years  . Osteoporosis 03-2012   T-score 1.5/2.8    Past Surgical History:  Procedure Laterality Date  . RETINAL TEAR REPAIR CRYOTHERAPY Right   . right foot surgery Right 03/23/2018  . salivatory gland  1990   Blocked salivatory gland removed  .  SKIN CANCER EXCISION    . TONSILLECTOMY AND ADENOIDECTOMY  Age 74  . TUBAL LIGATION  1980   Family History  Problem Relation Age of Onset  . Hypertension Mother   . Atrial fibrillation Mother   . Dementia Mother   . Hypertension Father   . Cancer Father        colon  . Neuropathy Sister   . Diabetes Maternal Grandmother   . Cancer Paternal Grandmother    Social History   Socioeconomic History  . Marital status: Married    Spouse name: Not on file  . Number of children: 2  . Years of education: 16  . Highest education level: Bachelor's degree (e.g., BA, AB, BS)  Occupational History  . Occupation: retired  Tobacco Use  . Smoking status: Former Smoker    Quit date: 01/26/1988    Years since quitting: 32.5  . Smokeless tobacco: Never Used  Vaping Use  . Vaping Use: Never used  Substance and Sexual Activity  . Alcohol use: Yes    Alcohol/week: 1.0 standard drink    Types: 1 Glasses of wine per week    Comment: 4-5 per week  . Drug use: No  . Sexual activity: Yes    Partners: Male    Birth control/protection: Surgical    Comment: BTL  Other Topics Concern  . Not on file  Social History Narrative   HH 3  3 dogs   Retired Copywriter, advertising   2 grandchildren   Social Determinants of Radio broadcast assistant Strain: [place]   . Difficulty of Paying Living Expenses: Not hard at all  Food Insecurity: No Food Insecurity  . Worried About Charity fundraiser in the Last Year: Never true  . Ran Out of Food in the Last Year: Never true  Transportation Needs: No Transportation Needs  . Lack of Transportation (Medical): No  . Lack of Transportation (Non-Medical): No  Physical Activity: Inactive  . Days of Exercise per Week: 0 days  . Minutes of Exercise per Session: 0 min  Stress: No Stress Concern Present  . Feeling of Stress : Not at all  Social Connections: Moderately Isolated  . Frequency of Communication with Friends and Family: Three times a week  . Frequency of Social  Gatherings with Friends and Family: Once a week  . Attends Religious Services: Never  . Active Member of Clubs or Organizations: No  . Attends Archivist Meetings: Never  . Marital Status: Married    Tobacco Counseling Counseling given: Not Answered   Clinical Intake:  Pre-visit preparation completed: Yes  Pain : No/denies pain     Nutritional Risks: None  How often do you need to have someone help you when you read instructions, pamphlets, or other written materials from your doctor or pharmacy?: 1 - Never  Diabetic?No  Interpreter Needed?: No  Information entered by :: [place] of Daily Living In your present state of health, do you have any difficulty performing the following activities: 08/14/2020  Hearing? N  Vision? N  Difficulty concentrating or making decisions? N  Walking or climbing stairs? N  Dressing or bathing? N  Doing errands, shopping? N  Preparing Food and eating ? N  Using the Toilet? N  In the past six months, have you accidently leaked urine? Y  Do you have problems with loss of bowel control? N  Managing your Medications? N  Managing your Finances? N  Housekeeping or managing your Housekeeping? N  Some recent data might be hidden    Patient Care Team: Martinique, Betty G, MD as PCP - General (Family Medicine) Nunzio Cobbs, MD as Consulting Physician (Obstetrics and Gynecology)  Indicate any recent Medical Services you may have received from other than [facility] providers in the past year (date may be approximate).     Assessment:   This is a routine wellness examination for Hannah Jennings.  Hearing/Vision screen  Hearing Screening   125Hz  250Hz  500Hz  1000Hz  2000Hz  3000Hz  4000Hz  6000Hz  8000Hz   Right ear:           Left ear:           Vision Screening Comments: States gets eyes examined once per year. Had extraction of right cataract. Left cataract nearing surgical removal. Wears reading glasses.  Dietary issues  and exercise activities discussed: Current Exercise Habits: The patient does not participate in regular exercise at present, Exercise limited by: None identified  Goals    . Patient Stated     Lose weight      Depression Screen PHQ 2/9 Scores 08/14/2020 07/21/2019 06/25/2017  PHQ - 2 Score 0 0 0    Fall Risk Fall Risk  08/14/2020 07/21/2019 06/25/2017  Falls in the past year? 0 0 No  Number falls in past yr: 0 - -  Injury with Fall? 0 - -  Risk for fall due to : No  Fall Risks Medication side effect -  Follow up Falls evaluation completed;Falls prevention discussed Falls evaluation completed;Education provided;Falls prevention discussed -    FALL RISK PREVENTION PERTAINING TO THE HOME:  Any stairs in or around the home? No  If so, are there any without handrails? No  Home free of loose throw rugs in walkways, pet beds, electrical cords, etc? Yes  Adequate lighting in your home to reduce risk of falls? Yes   ASSISTIVE DEVICES UTILIZED TO PREVENT FALLS:  Life alert? No  Use of a cane, walker or w/c? No  Grab bars in the bathroom? Yes  Shower chair or bench in shower? No  Elevated toilet seat or a handicapped toilet? No   TIMED UP AND GO:  Was the test performed? Yes .  Length of time to ambulate 10 feet: 3 sec.   Gait steady and fast without use of assistive device  Cognitive Function:     6CIT Screen 07/21/2019  What Year? 0 points  What month? 0 points  What time? 0 points  Count back from 20 0 points  Months in reverse 0 points  Repeat phrase 0 points  Total Score 0    Immunizations Immunization History  Administered Date(s) Administered  . Influenza, High Dose Seasonal PF 03/18/2016, 06/03/2017, 06/29/2018, 03/28/2019, 04/20/2020  . Moderna Sars-Covid-2 Vaccination 08/07/2019, 09/04/2019, 04/20/2020  . Pneumococcal Conjugate-13 08/14/2020  . Pneumococcal Polysaccharide-23 06/25/2017  . Tdap 12/08/2007, 12/24/2017    TDAP status: Up to date  Flu Vaccine  status: Up to date  Pneumococcal vaccine status: Up to date  Covid-19 vaccine status: Completed vaccines  Qualifies for Shingles Vaccine? Yes   Zostavax completed No   Shingrix Completed?: No.    Education has been provided regarding the importance of this vaccine. Patient has been advised to call insurance company to determine out of pocket expense if they have not yet received this vaccine. Advised may also receive vaccine at local pharmacy or Health Dept. Verbalized acceptance and understanding.  Screening Tests Health Maintenance  Topic Date Due  . MAMMOGRAM  08/05/2021  . COLONOSCOPY (Pts 45-34yrs Insurance coverage will need to be confirmed)  04/03/2026  . TETANUS/TDAP  12/25/2027  . INFLUENZA VACCINE  Completed  . DEXA SCAN  Completed  . COVID-19 Vaccine  Completed  . Hepatitis C Screening  Completed  . PNA vac Low Risk Adult  Completed    Health Maintenance  There are no preventive care reminders to display for this patient.  Colorectal cancer screening: Type of screening: Colonoscopy. Completed 04/03/2016. Repeat every 10 years  Mammogram status: Ordered 06/15/2020. Pt provided with contact info and advised to call to schedule appt.   Bone Density status: Ordered 06/13/2020. Pt provided with contact info and advised to call to schedule appt.  Lung Cancer Screening: (Low Dose CT Chest recommended if Age 31-80 years, 30 pack-year currently smoking OR have quit w/in 15years.) does not qualify.   Lung Cancer Screening Referral: N/A   Additional Screening:  Hepatitis C Screening: does qualify; Completed 06/25/2017  Vision Screening: Recommended annual ophthalmology exams for early detection of glaucoma and other disorders of the eye. Is the patient up to date with their annual eye exam?  Yes  Who is the provider or what is the name of the office in which the patient attends annual eye exams? Dr. Delman Cheadle  If pt is not established with a provider, would they like to be  referred to a provider to establish care? No .  Dental Screening: Recommended annual dental exams for proper oral hygiene  Community Resource Referral / Chronic Care Management: CRR required this visit?  No   CCM required this visit?  No      Plan:     I have personally reviewed and noted the following in the patient's chart:   . Medical and social history . Use of alcohol, tobacco or illicit drugs  . Current medications and supplements . Functional ability and status . Nutritional status . Physical activity . Advanced directives . List of other physicians . Hospitalizations, surgeries, and ER visits in previous 12 months . Vitals . Screenings to include cognitive, depression, and falls . Referrals and appointments  In addition, I have reviewed and discussed with patient certain preventive protocols, quality metrics, and best practice recommendations. A written personalized care plan for preventive services as well as general preventive health recommendations were provided to patient.     Ofilia Neas, LPN   08/07/863   Nurse Notes: None

## 2020-08-14 ENCOUNTER — Other Ambulatory Visit: Payer: Self-pay

## 2020-08-14 ENCOUNTER — Ambulatory Visit (INDEPENDENT_AMBULATORY_CARE_PROVIDER_SITE_OTHER): Payer: Medicare Other

## 2020-08-14 VITALS — BP 130/80 | HR 82 | Temp 98.2°F | Ht 64.0 in | Wt 143.6 lb

## 2020-08-14 DIAGNOSIS — Z23 Encounter for immunization: Secondary | ICD-10-CM | POA: Diagnosis not present

## 2020-08-14 DIAGNOSIS — Z0000 Encounter for general adult medical examination without abnormal findings: Secondary | ICD-10-CM

## 2020-08-14 NOTE — Patient Instructions (Signed)
Hannah Jennings , Thank you for taking time to come for your Medicare Wellness Visit. I appreciate your ongoing commitment to your health goals. Please review the following plan we discussed and let me know if I can assist you in the future.   Screening recommendations/referrals: Colonoscopy: Up to date next due 04/03/2026 Mammogram: Currently due, please keep appointment for 09/29/2020 Bone Density: Currently due, please keep appointment for 09/29/2020  Recommended yearly ophthalmology/optometry visit for glaucoma screening and checkup Recommended yearly dental visit for hygiene and checkup  Vaccinations: Influenza vaccine: Up to date, next due fall 2022  Pneumococcal vaccine: You received Prevnar 18 today and have completed series  Tdap vaccine: Up to date, next due 12/25/2027 Shingles vaccine: Currently due, we recommend that you receive at your local pharmacy as it is less expensive    Advanced directives: Please bring copies of your advanced medical directives so that we may scan it into your chart   Conditions/risks identified: None   Next appointment: 08/28/2020 @ 12:00 PM with Dr. Martinique    Preventive Care 65 Years and Older, Female Preventive care refers to lifestyle choices and visits with your health care provider that can promote health and wellness. What does preventive care include?  A yearly physical exam. This is also called an annual well check.  Dental exams once or twice a year.  Routine eye exams. Ask your health care provider how often you should have your eyes checked.  Personal lifestyle choices, including:  Daily care of your teeth and gums.  Regular physical activity.  Eating a healthy diet.  Avoiding tobacco and drug use.  Limiting alcohol use.  Practicing safe sex.  Taking low-dose aspirin every day.  Taking vitamin and mineral supplements as recommended by your health care provider. What happens during an annual well check? The services and  screenings done by your health care provider during your annual well check will depend on your age, overall health, lifestyle risk factors, and family history of disease. Counseling  Your health care provider may ask you questions about your:  Alcohol use.  Tobacco use.  Drug use.  Emotional well-being.  Home and relationship well-being.  Sexual activity.  Eating habits.  History of falls.  Memory and ability to understand (cognition).  Work and work Statistician.  Reproductive health. Screening  You may have the following tests or measurements:  Height, weight, and BMI.  Blood pressure.  Lipid and cholesterol levels. These may be checked every 5 years, or more frequently if you are over 47 years old.  Skin check.  Lung cancer screening. You may have this screening every year starting at age 14 if you have a 30-pack-year history of smoking and currently smoke or have quit within the past 15 years.  Fecal occult blood test (FOBT) of the stool. You may have this test every year starting at age 56.  Flexible sigmoidoscopy or colonoscopy. You may have a sigmoidoscopy every 5 years or a colonoscopy every 10 years starting at age 66.  Hepatitis C blood test.  Hepatitis B blood test.  Sexually transmitted disease (STD) testing.  Diabetes screening. This is done by checking your blood sugar (glucose) after you have not eaten for a while (fasting). You may have this done every 1-3 years.  Bone density scan. This is done to screen for osteoporosis. You may have this done starting at age 37.  Mammogram. This may be done every 1-2 years. Talk to your health care provider about how often you  should have regular mammograms. Talk with your health care provider about your test results, treatment options, and if necessary, the need for more tests. Vaccines  Your health care provider may recommend certain vaccines, such as:  Influenza vaccine. This is recommended every  year.  Tetanus, diphtheria, and acellular pertussis (Tdap, Td) vaccine. You may need a Td booster every 10 years.  Zoster vaccine. You may need this after age 2.  Pneumococcal 13-valent conjugate (PCV13) vaccine. One dose is recommended after age 63.  Pneumococcal polysaccharide (PPSV23) vaccine. One dose is recommended after age 80. Talk to your health care provider about which screenings and vaccines you need and how often you need them. This information is not intended to replace advice given to you by your health care provider. Make sure you discuss any questions you have with your health care provider. Document Released: 07/07/2015 Document Revised: 02/28/2016 Document Reviewed: 04/11/2015 Elsevier Interactive Patient Education  2017 [employer] Prevention in the Home Falls can cause injuries. They can happen to people of all ages. There are many things you can do to make your home safe and to help prevent falls. What can I do on the outside of my home?  Regularly fix the edges of walkways and driveways and fix any cracks.  Remove anything that might make you trip as you walk through a door, such as a raised step or threshold.  Trim any bushes or trees on the path to your home.  Use bright outdoor lighting.  Clear any walking paths of anything that might make someone trip, such as rocks or tools.  Regularly check to see if handrails are loose or broken. Make sure that both sides of any steps have handrails.  Any raised decks and porches should have guardrails on the edges.  Have any leaves, snow, or ice cleared regularly.  Use sand or salt on walking paths during winter.  Clean up any spills in your garage right away. This includes oil or grease spills. What can I do in the bathroom?  Use night lights.  Install grab bars by the toilet and in the tub and shower. Do not use towel bars as grab bars.  Use non-skid mats or decals in the tub or shower.  If you  need to sit down in the shower, use a plastic, non-slip stool.  Keep the floor dry. Clean up any water that spills on the floor as soon as it happens.  Remove soap buildup in the tub or shower regularly.  Attach bath mats securely with double-sided non-slip rug tape.  Do not have throw rugs and other things on the floor that can make you trip. What can I do in the bedroom?  Use night lights.  Make sure that you have a light by your bed that is easy to reach.  Do not use any sheets or blankets that are too big for your bed. They should not hang down onto the floor.  Have a firm chair that has side arms. You can use this for support while you get dressed.  Do not have throw rugs and other things on the floor that can make you trip. What can I do in the kitchen?  Clean up any spills right away.  Avoid walking on wet floors.  Keep items that you use a lot in easy-to-reach places.  If you need to reach something above you, use a strong step stool that has a grab bar.  Keep electrical cords out  of the way.  Do not use floor polish or wax that makes floors slippery. If you must use wax, use non-skid floor wax.  Do not have throw rugs and other things on the floor that can make you trip. What can I do with my stairs?  Do not leave any items on the stairs.  Make sure that there are handrails on both sides of the stairs and use them. Fix handrails that are broken or loose. Make sure that handrails are as long as the stairways.  Check any carpeting to make sure that it is firmly attached to the stairs. Fix any carpet that is loose or worn.  Avoid having throw rugs at the top or bottom of the stairs. If you do have throw rugs, attach them to the floor with carpet tape.  Make sure that you have a light switch at the top of the stairs and the bottom of the stairs. If you do not have them, ask someone to add them for you. What else can I do to help prevent falls?  Wear shoes  that:  Do not have high heels.  Have rubber bottoms.  Are comfortable and fit you well.  Are closed at the toe. Do not wear sandals.  If you use a stepladder:  Make sure that it is fully opened. Do not climb a closed stepladder.  Make sure that both sides of the stepladder are locked into place.  Ask someone to hold it for you, if possible.  Clearly mark and make sure that you can see:  Any grab bars or handrails.  First and last steps.  Where the edge of each step is.  Use tools that help you move around (mobility aids) if they are needed. These include:  Canes.  Walkers.  Scooters.  Crutches.  Turn on the lights when you go into a dark area. Replace any light bulbs as soon as they burn out.  Set up your furniture so you have a clear path. Avoid moving your furniture around.  If any of your floors are uneven, fix them.  If there are any pets around you, be aware of where they are.  Review your medicines with your doctor. Some medicines can make you feel dizzy. This can increase your chance of falling. Ask your doctor what other things that you can do to help prevent falls. This information is not intended to replace advice given to you by your health care provider. Make sure you discuss any questions you have with your health care provider. Document Released: 04/06/2009 Document Revised: 11/16/2015 Document Reviewed: 07/15/2014 Elsevier Interactive Patient Education  2017 [employer].

## 2020-08-25 ENCOUNTER — Other Ambulatory Visit: Payer: Self-pay

## 2020-08-28 ENCOUNTER — Encounter: Payer: Self-pay | Admitting: Family Medicine

## 2020-08-28 ENCOUNTER — Other Ambulatory Visit: Payer: Self-pay

## 2020-08-28 ENCOUNTER — Ambulatory Visit (INDEPENDENT_AMBULATORY_CARE_PROVIDER_SITE_OTHER): Payer: Medicare Other | Admitting: Family Medicine

## 2020-08-28 VITALS — BP 124/70 | HR 96 | Resp 16 | Ht 64.0 in | Wt 144.0 lb

## 2020-08-28 DIAGNOSIS — M25561 Pain in right knee: Secondary | ICD-10-CM | POA: Diagnosis not present

## 2020-08-28 DIAGNOSIS — M25562 Pain in left knee: Secondary | ICD-10-CM

## 2020-08-28 DIAGNOSIS — F330 Major depressive disorder, recurrent, mild: Secondary | ICD-10-CM

## 2020-08-28 DIAGNOSIS — H60543 Acute eczematoid otitis externa, bilateral: Secondary | ICD-10-CM

## 2020-08-28 DIAGNOSIS — E782 Mixed hyperlipidemia: Secondary | ICD-10-CM | POA: Diagnosis not present

## 2020-08-28 DIAGNOSIS — Z0000 Encounter for general adult medical examination without abnormal findings: Secondary | ICD-10-CM

## 2020-08-28 LAB — COMPREHENSIVE METABOLIC PANEL
ALT: 23 U/L (ref 0–35)
AST: 21 U/L (ref 0–37)
Albumin: 4.3 g/dL (ref 3.5–5.2)
Alkaline Phosphatase: 68 U/L (ref 39–117)
BUN: 14 mg/dL (ref 6–23)
CO2: 30 mEq/L (ref 19–32)
Calcium: 9 mg/dL (ref 8.4–10.5)
Chloride: 103 mEq/L (ref 96–112)
Creatinine, Ser: 0.82 mg/dL (ref 0.40–1.20)
GFR: 72.39 mL/min (ref >60.00)
Glucose, Bld: 89 mg/dL (ref 70–99)
Potassium: 4.7 mEq/L (ref 3.5–5.1)
Sodium: 139 mEq/L (ref 135–145)
Total Bilirubin: 0.6 mg/dL (ref 0.2–1.2)
Total Protein: 7.1 g/dL (ref 6.0–8.3)

## 2020-08-28 LAB — LIPID PANEL
Cholesterol: 216 mg/dL — ABNORMAL HIGH (ref 0–200)
HDL: 66 mg/dL (ref >39.00)
LDL Cholesterol: 128 mg/dL — ABNORMAL HIGH (ref 0–99)
NonHDL: 150.27
Total CHOL/HDL Ratio: 3
Triglycerides: 111 mg/dL (ref 0.0–149.0)
VLDL: 22.2 mg/dL (ref 0.0–40.0)

## 2020-08-28 MED ORDER — DESONIDE 0.05 % EX CREA: TOPICAL_CREAM | Freq: Two times a day (BID) | CUTANEOUS | 1 refills | Status: DC

## 2020-08-28 MED ORDER — DULOXETINE HCL 60 MG PO CPEP: ORAL_CAPSULE | ORAL | 3 refills | Status: DC

## 2020-08-28 NOTE — Patient Instructions (Addendum)
Today you have you routine preventive visit. A few things to remember from today's visit:  Routine general medical examination at a health care facility  Arthralgia of both lower legs - Plan: DULoxetine (CYMBALTA) 60 MG capsule  Depression, major, recurrent, mild (HCC) - Plan: DULoxetine (CYMBALTA) 60 MG capsule  Mixed hyperlipidemia - Plan: Comprehensive metabolic panel, Lipid panel  If you need refills please call your pharmacy. Do not use My Chart to request refills or for acute issues that need immediate attention.   Please be sure medication list is accurate. If a new problem present, please set up appointment sooner than planned today.  At least 150 minutes of moderate exercise per week, daily brisk walking for 15-30 min is a good exercise option. Healthy diet low in saturated (animal) fats and sweets and consisting of fresh fruits and vegetables, lean meats such as fish and white chicken and whole grains.  These are some of recommendations for screening depending of age and risk factors: A few tips:  -As we age balance is not as good as it was, so there is a higher risks for falls. Please remove small rugs and furniture that is "in your way" and could increase the risk of falls. Stretching exercises may help with fall prevention: Yoga and Tai Chi are some examples. Low impact exercise is better, so you are not very achy the next day.  -Sun screen and avoidance of direct sun light recommended. Caution with dehydration, if working outdoors be sure to drink enough fluids.  - Some medications are not safe as we age, increases the risk of side effects and can potentially interact with other medication you are also taken;  including some of over the counter medications. Be sure to let me know when you start a new medication even if it is a dietary/vitamin supplement.  -Healthy diet low in red meet/animal fat and sugar + regular physical activity is recommended.   -  Vaccines:  Influenza vaccine annually.  Colon cancer screening: Has been recently changed to 71 yo. Insurance may not cover until you are 71 years old. Screening is recommended until 71 years old.  Also recommended:  1. Dental visit- Brush and floss your teeth twice daily; visit your dentist twice a year. 2. Eye doctor- Get an eye exam at least every 2 years. 3. Helmet use- Always wear a helmet when riding a bicycle, motorcycle, rollerblading or skateboarding. 4. Safe sex- If you may be exposed to sexually transmitted infections, use a condom. 5. Seat belts- Seat belts can save your live; always wear one. 6. Smoke/Carbon Monoxide detectors- These detectors need to be installed on the appropriate level of your home. Replace batteries at least once a year. 7. Skin cancer- When out in the sun please cover up and use sunscreen 15 SPF or higher. 8. Violence- If anyone is threatening or hurting you, please tell your healthcare provider.  9. Drink alcohol in moderation- Limit alcohol intake to one drink or less per day. Never drink and drive. 10. Calcium supplementation 1000 to 1200 mg daily, ideally through your diet.  Vitamin D supplementation 800 units daily.

## 2020-08-28 NOTE — Assessment & Plan Note (Signed)
Problem is adequately controlled. Continue Cymbalta 60 mg daily.

## 2020-08-28 NOTE — Assessment & Plan Note (Signed)
Problem is well controlled. Continue Cymbalta 60 mg daily. Follow-up in a year, before if needed.

## 2020-08-28 NOTE — Progress Notes (Signed)
HPI: Hannah Jennings is a pleasant 70 y.o. female, who is here today for her routine physical.  Last CPE: A few years ago.  Regular exercise 3 or more time per week: She does not have a regular exercise routine but she is active with house chores, yard work, and walking her dogs daily. Following a healthful diet: She tries to do so, she cooks at home. She lives with her husband.  Chronic medical problems: GERD,osteoporosis,HLD,depression,and OA among some.  Pap smear: 03/29/19 She sees Dr Burton Apley regularly.  Immunization History  Administered Date(s) Administered  . Influenza, High Dose Seasonal PF 03/18/2016, 06/03/2017, 06/29/2018, 03/28/2019, 04/20/2020  . Moderna Sars-Covid-2 Vaccination 08/07/2019, 09/04/2019, 04/20/2020  . Pneumococcal Conjugate-13 08/14/2020  . Pneumococcal Polysaccharide-23 06/25/2017  . Tdap 12/08/2007, 12/24/2017   Mammogram: 08/09/2019. Colonoscopy: 04/03/2016, 5-year follow-up recommended. DEXA: 06/08/2018.  Osteoporosis on Actonel 150 mg monthly, following with her gynecologist. She is on calcium and vitamin D supplementation.  She has appointment to repeat DEXA in 09/2020 along with her mammogram. Hep C screening: 06/25/17 NR.  Depression and chronic lower extremity pain have been well controlled with Cymbalta 60 mg daily. Since her last visit she lost her son (2021), she feels like Cymbalta helped her to deal with this. Sleeping well, 8 hours.  Depression screen [facility] 2/9 08/28/2020 08/14/2020 07/21/2019 06/25/2017  Decreased Interest 0 0 0 0  Down, Depressed, Hopeless 0 0 0 0  PHQ - 2 Score 0 0 0 0  Altered sleeping 0 - - -  Tired, decreased energy 0 - - -  Change in appetite 0 - - -  Feeling bad or failure about yourself  0 - - -  Trouble concentrating 0 - - -  Moving slowly or fidgety/restless 0 - - -  Suicidal thoughts 0 - - -  PHQ-9 Score 0 - - -  Difficult doing work/chores Not difficult at all - - -   GAD 7 : Generalized Anxiety  Score 08/28/2020  Nervous, Anxious, on Edge 0  Control/stop worrying 0  Worry too much - different things 0  Trouble relaxing 0  Restless 0  Easily annoyed or irritable 0  Afraid - awful might happen 0  Total GAD 7 Score 0  Anxiety Difficulty Not difficult at all   External ear pruritus: This is a chronic problem, affecting both ears. She has been on topical desonide, which has really help with problem. Last prescription of desonide cream in 2017. She has not noted changes in hearing, earache, or ear drainage.  Hyperlipidemia: Currently she is on nonpharmacologic treatment.  Lab Results  Component Value Date   CHOL 230 (H) 06/29/2018   HDL 63.30 06/29/2018   LDLCALC 145 (H) 06/29/2018   TRIG 112.0 06/29/2018   CHOLHDL 4 06/29/2018   Review of Systems  Constitutional: Negative for activity change, appetite change and fever.  HENT: Negative for mouth sores, nosebleeds and sore throat.   Eyes: Negative for redness and visual disturbance.  Respiratory: Negative for cough, shortness of breath and wheezing.   Cardiovascular: Negative for chest pain, palpitations and leg swelling.  Gastrointestinal: Negative for abdominal pain, nausea and vomiting.       Negative for changes in bowel habits.  Endocrine: Negative for cold intolerance, heat intolerance, polydipsia, polyphagia and polyuria.  Genitourinary: Negative for decreased urine volume, dysuria and hematuria.  Musculoskeletal: Negative for gait problem and myalgias.  Skin: Negative for pallor and rash.  Allergic/Immunologic: Negative for environmental allergies.  Neurological: Negative for  syncope, weakness and headaches.  Hematological: Negative for adenopathy. Does not bruise/bleed easily.  Psychiatric/Behavioral: Negative for confusion, hallucinations and sleep disturbance.  All other systems reviewed and are negative.  Current Outpatient Medications on File Prior to Visit  Medication Sig Dispense Refill  . cholecalciferol  (VITAMIN D) 1000 UNITS tablet Take 1,000 Units by mouth daily.     Marland Kitchen conjugated estrogens (PREMARIN) vaginal cream INSERT 1/2 GRAM IN THE VAGINA AT BEDTIME 2 TIMES A WEEK 30 g 1  . risedronate (ACTONEL) 150 MG tablet TAKE 1 TABLET BY MOUTH EVERY MONTH WITH WATER AND ON AN EMPTY STOMACH. DO NOT LIE DOWN FOR NEXT 30 MINUTES AFTER TAKING* 3 tablet 3   No current facility-administered medications on file prior to visit.   Past Medical History:  Diagnosis Date  . Cancer ([place]) 2018   basal cell--chest and left ear  . Oral lichen planus    --hx of oral--pt. states for years  . Osteoporosis 03-2012   T-score 1.5/2.8     Past Surgical History:  Procedure Laterality Date  . RETINAL TEAR REPAIR CRYOTHERAPY Right   . right foot surgery Right 03/23/2018  . salivatory gland  1990   Blocked salivatory gland removed  . SKIN CANCER EXCISION    . TONSILLECTOMY AND ADENOIDECTOMY  Age 42  . TUBAL LIGATION  1980    No Known Allergies  Family History  Problem Relation Age of Onset  . Hypertension Mother   . Atrial fibrillation Mother   . Dementia Mother   . Hypertension Father   . Cancer Father        colon  . Neuropathy Sister   . Diabetes Maternal Grandmother   . Cancer Paternal Grandmother     Social History   Socioeconomic History  . Marital status: Married    Spouse name: Not on file  . Number of children: 2  . Years of education: 16  . Highest education level: Bachelor's degree (e.g., BA, AB, BS)  Occupational History  . Occupation: retired  Tobacco Use  . Smoking status: Former Smoker    Quit date: 01/26/1988    Years since quitting: 32.6  . Smokeless tobacco: Never Used  Vaping Use  . Vaping Use: Never used  Substance and Sexual Activity  . Alcohol use: Yes    Alcohol/week: 1.0 standard drink    Types: 1 Glasses of wine per week    Comment: 4-5 per week  . Drug use: No  . Sexual activity: Yes    Partners: Male    Birth control/protection: Surgical    Comment: BTL   Other Topics Concern  . Not on file  Social History Narrative   [place] 3   3 dogs   Retired Copywriter, advertising   2 grandchildren   Social Determinants of Radio broadcast assistant Strain: Low Risk   . Difficulty of Paying Living Expenses: Not hard at all  Food Insecurity: No Food Insecurity  . Worried About Charity fundraiser in the Last Year: Never true  . Ran Out of Food in the Last Year: Never true  Transportation Needs: No Transportation Needs  . Lack of Transportation (Medical): No  . Lack of Transportation (Non-Medical): No  Physical Activity: Inactive  . Days of Exercise per Week: 0 days  . Minutes of Exercise per Session: 0 min  Stress: No Stress Concern Present  . Feeling of Stress : Not at all  Social Connections: Moderately Isolated  . Frequency of Communication with Friends  and Family: Three times a week  . Frequency of Social Gatherings with Friends and Family: Once a week  . Attends Religious Services: Never  . Active Member of Clubs or Organizations: No  . Attends Archivist Meetings: Never  . Marital Status: Married   Vitals:   08/28/20 1134  BP: 124/70  Pulse: 96  Resp: 16  SpO2: 98%   Body mass index is 24.72 kg/m.  Wt Readings from Last 3 Encounters:  08/28/20 144 lb (65.3 kg)  08/14/20 143 lb 9 oz (65.1 kg)  03/30/20 140 lb (63.5 kg)   Physical Exam Vitals and nursing note reviewed.  Constitutional:      General: She is not in acute distress.    Appearance: She is well-developed.  HENT:     Head: Normocephalic and atraumatic.     Right Ear: Tympanic membrane, ear canal and external ear normal.     Left Ear: Tympanic membrane, ear canal and external ear normal.     Ears:     Comments: Ear canals: No erythema, mildly scaly skin.    Mouth/Throat:     Mouth: Oropharynx is clear and moist and mucous membranes are normal.     Pharynx: Uvula midline.  Eyes:     Extraocular Movements: Extraocular movements intact and EOM normal.      Conjunctiva/sclera: Conjunctivae normal.     Pupils: Pupils are equal, round, and reactive to light.  Neck:     Thyroid: No thyromegaly.     Trachea: No tracheal deviation.  Cardiovascular:     Rate and Rhythm: Normal rate and regular rhythm.     Pulses:          Dorsalis pedis pulses are 2+ on the right side and 2+ on the left side.     Heart sounds: No murmur heard.   Pulmonary:     Effort: Pulmonary effort is normal. No respiratory distress.     Breath sounds: Normal breath sounds.  Chest:  Breasts:     Right: No supraclavicular adenopathy.     Left: No supraclavicular adenopathy.    Abdominal:     Palpations: Abdomen is soft. There is no hepatomegaly or mass.     Tenderness: There is no abdominal tenderness.  Genitourinary:    Comments: Deferred to gyn. Musculoskeletal:        General: No edema.     Comments: No signs of synovitis appreciated. Hips with limitation of internal rotation, mild. Rest of ROM adequate. Knee crepitus, bilateral.  Lymphadenopathy:     Cervical: No cervical adenopathy.     Upper Body:     Right upper body: No supraclavicular adenopathy.     Left upper body: No supraclavicular adenopathy.  Skin:    General: Skin is warm.     Findings: No erythema or rash.  Neurological:     General: No focal deficit present.     Mental Status: She is alert and oriented to person, place, and time.     Cranial Nerves: No cranial nerve deficit.     Coordination: Coordination normal.     Gait: Gait normal.     Deep Tendon Reflexes: Strength normal.     Reflex Scores:      Bicep reflexes are 2+ on the right side and 2+ on the left side.      Patellar reflexes are 2+ on the right side and 2+ on the left side. Psychiatric:        Mood and  Affect: Mood and affect normal.        Speech: Speech normal.     Comments: Well groomed, good eye contact.   ASSESSMENT AND PLAN:  Hannah Jennings was here today annual physical examination.  Orders Placed This  Encounter  Procedures  . Comprehensive metabolic panel  . Lipid panel    Lab Results  Component Value Date   CREATININE 0.82 08/28/2020   BUN 14 08/28/2020   NA 139 08/28/2020   K 4.7 08/28/2020   CL 103 08/28/2020   CO2 30 08/28/2020   Lab Results  Component Value Date   ALT 23 08/28/2020   AST 21 08/28/2020   ALKPHOS 68 08/28/2020   BILITOT 0.6 08/28/2020   Lab Results  Component Value Date   CHOL 216 (H) 08/28/2020   HDL 66.00 08/28/2020   LDLCALC 128 (H) 08/28/2020   TRIG 111.0 08/28/2020   CHOLHDL 3 08/28/2020   Routine general medical examination at a health care facility We discussed the importance of regular physical activity and healthy diet for prevention of chronic illness and/or complications. Preventive guidelines reviewed. Vaccination up to date. Continue female preventive care with her gyn. Ca++ and vit D supplementation to continue. Next CPE in a year.  The 10-year ASCVD risk score Mikey Bussing DC Brooke Bonito., et al., 2013) is: 8.8%   Values used to calculate the score:     Age: 40 years     Sex: Female     Is Non-Hispanic African American: No     Diabetic: No     Tobacco smoker: No     Systolic Blood Pressure: 400 mmHg     Is BP treated: No     HDL Cholesterol: 66 mg/dL     Total Cholesterol: 216 mg/dL  Dermatitis of ear canal, bilateral Chronic, stable. Continue topical Desonide, small amount, bid prn.  -     desonide (DESOWEN) 0.05 % cream; Apply topically 2 (two) times daily.  Hyperlipidemia Continue nonpharmacologic treatment. Further recommendation will be given according to FLP results and 10-year CVD risk.  Depression, major, recurrent, mild ([place]) Problem is well controlled. Continue Cymbalta 60 mg daily. Follow-up in a year, before if needed.  Arthralgia of both lower legs Problem is adequately controlled. Continue Cymbalta 60 mg daily.  Return in 1 year (on 08/28/2021) for cpe and f/u.  Taeden Geller G. Martinique, MD  [facility]. [place] office.   Today you have you routine preventive visit. A few things to remember from today's visit:  Routine general medical examination at a health care facility  Arthralgia of both lower legs - Plan: DULoxetine (CYMBALTA) 60 MG capsule  Depression, major, recurrent, mild (HCC) - Plan: DULoxetine (CYMBALTA) 60 MG capsule  Mixed hyperlipidemia - Plan: Comprehensive metabolic panel, Lipid panel  If you need refills please call your pharmacy. Do not use My Chart to request refills or for acute issues that need immediate attention.   Please be sure medication list is accurate. If a new problem present, please set up appointment sooner than planned today.  At least 150 minutes of moderate exercise per week, daily brisk walking for 15-30 min is a good exercise option. Healthy diet low in saturated (animal) fats and sweets and consisting of fresh fruits and vegetables, lean meats such as fish and white chicken and whole grains.  These are some of recommendations for screening depending of age and risk factors: A few tips:  -As we age balance is not as good as  it was, so there is a higher risks for falls. Please remove small rugs and furniture that is "in your way" and could increase the risk of falls. Stretching exercises may help with fall prevention: Yoga and Tai Chi are some examples. Low impact exercise is better, so you are not very achy the next day.  -Sun screen and avoidance of direct sun light recommended. Caution with dehydration, if working outdoors be sure to drink enough fluids.  - Some medications are not safe as we age, increases the risk of side effects and can potentially interact with other medication you are also taken;  including some of over the counter medications. Be sure to let me know when you start a new medication even if it is a dietary/vitamin supplement.  -Healthy diet low in red meet/animal fat and sugar + regular physical activity is  recommended.   - Vaccines:  Influenza vaccine annually.  Colon cancer screening: Has been recently changed to 70 yo. Insurance may not cover until you are 71 years old. Screening is recommended until 71 years old.  Also recommended:  1. Dental visit- Brush and floss your teeth twice daily; visit your dentist twice a year. 2. Eye doctor- Get an eye exam at least every 2 years. 3. Helmet use- Always wear a helmet when riding a bicycle, motorcycle, rollerblading or skateboarding. 4. Safe sex- If you may be exposed to sexually transmitted infections, use a condom. 5. Seat belts- Seat belts can save your live; always wear one. 6. Smoke/Carbon Monoxide detectors- These detectors need to be installed on the appropriate level of your home. Replace batteries at least once a year. 7. Skin cancer- When out in the sun please cover up and use sunscreen 15 SPF or higher. 8. Violence- If anyone is threatening or hurting you, please tell your healthcare provider.  9. Drink alcohol in moderation- Limit alcohol intake to one drink or less per day. Never drink and drive. 10. Calcium supplementation 1000 to 1200 mg daily, ideally through your diet.  Vitamin D supplementation 800 units daily.

## 2020-08-28 NOTE — Assessment & Plan Note (Signed)
Continue nonpharmacologic treatment. Further recommendation will be given according to FLP results and 10-year CVD risk.

## 2020-09-29 ENCOUNTER — Ambulatory Visit
Admission: RE | Admit: 2020-09-29 | Discharge: 2020-09-29 | Disposition: A | Discharge status: Home / Self Care | Payer: Medicare Other | Source: Ambulatory Visit | Attending: Obstetrics and Gynecology | Admitting: Obstetrics and Gynecology

## 2020-09-29 ENCOUNTER — Other Ambulatory Visit: Payer: Medicare Other

## 2020-09-29 ENCOUNTER — Other Ambulatory Visit: Payer: Self-pay

## 2020-09-29 DIAGNOSIS — Z1231 Encounter for screening mammogram for malignant neoplasm of breast: Secondary | ICD-10-CM

## 2020-10-08 ENCOUNTER — Other Ambulatory Visit: Payer: Self-pay | Admitting: Family Medicine

## 2020-10-08 DIAGNOSIS — M25562 Pain in left knee: Secondary | ICD-10-CM

## 2020-10-08 DIAGNOSIS — M25561 Pain in right knee: Secondary | ICD-10-CM

## 2020-10-08 DIAGNOSIS — F330 Major depressive disorder, recurrent, mild: Secondary | ICD-10-CM

## 2021-03-16 ENCOUNTER — Other Ambulatory Visit: Payer: Self-pay | Admitting: Obstetrics and Gynecology

## 2021-03-16 DIAGNOSIS — Z7983 Long term (current) use of bisphosphonates: Secondary | ICD-10-CM

## 2021-03-16 DIAGNOSIS — Z780 Asymptomatic menopausal state: Secondary | ICD-10-CM

## 2021-03-16 DIAGNOSIS — Z8739 Personal history of other diseases of the musculoskeletal system and connective tissue: Secondary | ICD-10-CM

## 2021-03-21 ENCOUNTER — Other Ambulatory Visit: Payer: Medicare Other

## 2021-03-26 ENCOUNTER — Other Ambulatory Visit: Payer: Self-pay | Admitting: Obstetrics and Gynecology

## 2021-03-26 NOTE — Telephone Encounter (Signed)
Annual exam scheduled on 05/02/21

## 2021-05-02 ENCOUNTER — Ambulatory Visit (INDEPENDENT_AMBULATORY_CARE_PROVIDER_SITE_OTHER): Payer: Medicare Other | Admitting: Obstetrics and Gynecology

## 2021-05-02 ENCOUNTER — Other Ambulatory Visit: Payer: Self-pay

## 2021-05-02 ENCOUNTER — Encounter: Payer: Self-pay | Admitting: Obstetrics and Gynecology

## 2021-05-02 ENCOUNTER — Other Ambulatory Visit (HOSPITAL_COMMUNITY)
Admission: RE | Admit: 2021-05-02 | Discharge: 2021-05-02 | Disposition: A | Discharge status: Home / Self Care | Payer: Medicare Other | Source: Ambulatory Visit | Attending: Obstetrics and Gynecology | Admitting: Obstetrics and Gynecology

## 2021-05-02 VITALS — BP 138/70 | HR 85 | Ht 64.0 in | Wt 146.0 lb

## 2021-05-02 DIAGNOSIS — Z01419 Encounter for gynecological examination (general) (routine) without abnormal findings: Secondary | ICD-10-CM

## 2021-05-02 DIAGNOSIS — N952 Postmenopausal atrophic vaginitis: Secondary | ICD-10-CM | POA: Diagnosis not present

## 2021-05-02 DIAGNOSIS — Z124 Encounter for screening for malignant neoplasm of cervix: Secondary | ICD-10-CM

## 2021-05-02 DIAGNOSIS — Z780 Asymptomatic menopausal state: Secondary | ICD-10-CM

## 2021-05-02 DIAGNOSIS — M8580 Other specified disorders of bone density and structure, unspecified site: Secondary | ICD-10-CM

## 2021-05-02 MED ORDER — RISEDRONATE SODIUM 150 MG PO TABS: ORAL_TABLET | ORAL | 3 refills | Status: DC

## 2021-05-02 MED ORDER — PREMARIN 0.625 MG/GM VA CREA: TOPICAL_CREAM | VAGINAL | 1 refills | Status: DC

## 2021-05-02 NOTE — Progress Notes (Signed)
70 y.o. G33P2002 [name] here for annual exam.    No new health concerns.  Asking about continuation of Premarin.   She is followed for osteopenia and vaginal atrophy.   On Actonel since 2019 and using Premarin vaginal cream.   Normal CMP 08/28/20.    PCP:  Betty Martinique, MD   Patient's last menstrual period was 06/24/2002 (approximate).           Sexually active: No.  The current method of family planning is tubal ligation.    Exercising: Yes.     Walks dog Smoker:  no  Health Maintenance: Pap:   03-29-19 Neg, 03-19-17 Neg, 02-01-15 Neg, 01-25-13 Neg:Neg HR HPV History of abnormal Pap:  no MMG:  09-29-20 3D/Neg/BiRads1 Colonoscopy: 2016 normal;next 2022 d/t family hx of colon cancer in father BMD:  06-08-18  Result :Osteopenia of hip and spine.  Increased risk of fracture by FRAX.  --Appt. 08/2021 TDaP:  12/2017 Gardasil:   no [id number] Hep C: 06-25-17 Neg Screening Labs:   PCP. Flu vaccine:  completed.  She plans to do Shingrix.    reports that she quit smoking about 33 years ago. Her smoking use included cigarettes. She has never used smokeless tobacco. She reports current alcohol use of about 2.0 standard drinks per week. She reports that she does not use drugs.  Past Medical History:  Diagnosis Date   Cancer ([place]) 2018   basal cell--chest and left ear   Oral lichen planus    --hx of oral--pt. states for years   Osteoporosis 03-2012   T-score 1.5/2.8     Past Surgical History:  Procedure Laterality Date   RETINAL TEAR REPAIR CRYOTHERAPY Right    right foot surgery Right 03/23/2018   salivatory gland  1990   Blocked salivatory gland removed   SKIN CANCER EXCISION     TONSILLECTOMY AND ADENOIDECTOMY  Age 53   TUBAL LIGATION  1980    Current Outpatient Medications  Medication Sig Dispense Refill   cholecalciferol (VITAMIN D) 1000 UNITS tablet Take 1,000 Units by mouth daily.      conjugated estrogens (PREMARIN) vaginal cream INSERT 1/2 GRAM IN THE VAGINA  AT BEDTIME 2 TIMES A WEEK 30 g 1   desonide (DESOWEN) 0.05 % cream Apply topically 2 (two) times daily. 30 g 1   DULoxetine (CYMBALTA) 60 MG capsule TAKE 1 CAPSULE(60 MG) BY MOUTH DAILY 90 capsule 3   risedronate (ACTONEL) 150 MG tablet TAKE 1 TABLET BY MOUTH EVERY MONTH WITH WATER AND ON AN EMPTY STOMACH. DO NOT LIE DOWN FOR NEXT 30 MINUTES AFTER TAKING* 3 tablet 0   No current facility-administered medications for this visit.    Family History  Problem Relation Age of Onset   Hypertension Mother    Atrial fibrillation Mother    Dementia Mother    Hypertension Father    Cancer Father        colon   Neuropathy Sister    Diabetes Maternal Grandmother    Cancer Paternal Grandmother     Review of Systems  All other systems reviewed and are negative.  Exam:   BP 138/70   Pulse 85   Ht 5\' 4"  (1.626 m)   Wt 146 lb (66.2 kg)   LMP 06/24/2002 (Approximate)   SpO2 99%   BMI 25.06 kg/m     General appearance: alert, cooperative and appears stated age Head: normocephalic, without obvious abnormality, atraumatic Neck: no adenopathy, supple, symmetrical, trachea midline and thyroid normal to  inspection and palpation Lungs: clear to auscultation bilaterally Breasts: normal appearance, no masses or tenderness, No nipple retraction or dimpling, No nipple discharge or bleeding, No axillary adenopathy Heart: regular rate and rhythm Abdomen: soft, non-tender; no masses, no organomegaly Extremities: extremities normal, atraumatic, no cyanosis or edema Skin: skin color, texture, turgor normal. No rashes or lesions Lymph nodes: cervical, supraclavicular, and axillary nodes normal. Neurologic: grossly normal  Pelvic: External genitalia:  no lesions              No abnormal inguinal nodes palpated.              Urethra:  normal appearing urethra with no masses, tenderness or lesions              Bartholins and Skenes: normal                 Vagina: normal appearing vagina with normal color  and discharge, no lesions              Cervix: no lesions              Pap taken: yes Bimanual Exam:  Uterus:  normal size, contour, position, consistency, mobility, non-tender              Adnexa: no mass, fullness, tenderness              Rectal exam: yes.  Confirms.              Anus:  normal sphincter tone, no lesions  Chaperone was present for exam:  Estill Bamberg, CMA  Assessment:   Well woman visit with gynecologic exam. Osteopenia.  Increased fracture risk.  On Actonel.  Atrophy of vagina. FH colon cancer.   Plan: Mammogram screening discussed. Self breast awareness reviewed. Pap and HR HPV as above. Guidelines for Calcium, Vitamin D, regular exercise program including cardiovascular and weight bearing exercise. Refill of Premarin vaginal cream.  I discussed potential effect on breast cancer and blood clotting.  Refill of Actonel 150 mg monthly for one year.   BMD in March 2023.  Fu yearly and prn.   26 min total time was spent for this patient encounter, including preparation, face-to-face counseling with the patient, coordination of care, and documentation of the encounter.  After visit summary provided.

## 2021-05-02 NOTE — Patient Instructions (Signed)

## 2021-05-03 LAB — CYTOLOGY - PAP: Diagnosis: NEGATIVE

## 2021-07-17 ENCOUNTER — Ambulatory Visit (INDEPENDENT_AMBULATORY_CARE_PROVIDER_SITE_OTHER): Payer: Medicare Other

## 2021-07-17 VITALS — BP 128/72 | HR 87 | Temp 98.1°F | Ht 64.0 in | Wt 146.0 lb

## 2021-07-17 DIAGNOSIS — Z0000 Encounter for general adult medical examination without abnormal findings: Secondary | ICD-10-CM | POA: Diagnosis not present

## 2021-07-17 NOTE — Patient Instructions (Signed)
Hannah Jennings , Thank you for taking time to come for your Medicare Wellness Visit. I appreciate your ongoing commitment to your health goals. Please review the following plan we discussed and let me know if I can assist you in the future.   Screening recommendations/referrals: Colonoscopy: completed 04/03/2016 Mammogram: completed 09/29/2020 Bone Density: scheduled in March Recommended yearly ophthalmology/optometry visit for glaucoma screening and checkup Recommended yearly dental visit for hygiene and checkup  Vaccinations: Influenza vaccine: completed 03/28/2021 Pneumococcal vaccine: completed 08/14/2020 Tdap vaccine: completed 12/24/2017 Shingles vaccine: needs second dose   Covid-19: 03/28/2021, 04/20/2020, 09/04/2019, 08/07/2019  Advanced directives: Please bring a copy of your POA (Power of Attorney) and/or Living Will to your next appointment.   Conditions/risks identified: none  Next appointment: Follow up in one year for your annual wellness visit    Preventive Care 65 Years and Older, Female Preventive care refers to lifestyle choices and visits with your health care provider that can promote health and wellness. What does preventive care include? A yearly physical exam. This is also called an annual well check. Dental exams once or twice a year. Routine eye exams. Ask your health care provider how often you should have your eyes checked. Personal lifestyle choices, including: Daily care of your teeth and gums. Regular physical activity. Eating a healthy diet. Avoiding tobacco and drug use. Limiting alcohol use. Practicing safe sex. Taking low-dose aspirin every day. Taking vitamin and mineral supplements as recommended by your health care provider. What happens during an annual well check? The services and screenings done by your health care provider during your annual well check will depend on your age, overall health, lifestyle risk factors, and family history of  disease. Counseling  Your health care provider may ask you questions about your: Alcohol use. Tobacco use. Drug use. Emotional well-being. Home and relationship well-being. Sexual activity. Eating habits. History of falls. Memory and ability to understand (cognition). Work and work Statistician. Reproductive health. Screening  You may have the following tests or measurements: Height, weight, and BMI. Blood pressure. Lipid and cholesterol levels. These may be checked every 5 years, or more frequently if you are over 78 years old. Skin check. Lung cancer screening. You may have this screening every year starting at age 11 if you have a 30-pack-year history of smoking and currently smoke or have quit within the past 15 years. Fecal occult blood test (FOBT) of the stool. You may have this test every year starting at age 49. Flexible sigmoidoscopy or colonoscopy. You may have a sigmoidoscopy every 5 years or a colonoscopy every 10 years starting at age 78. Hepatitis C blood test. Hepatitis B blood test. Sexually transmitted disease (STD) testing. Diabetes screening. This is done by checking your blood sugar (glucose) after you have not eaten for a while (fasting). You may have this done every 1-3 years. Bone density scan. This is done to screen for osteoporosis. You may have this done starting at age 41. Mammogram. This may be done every 1-2 years. Talk to your health care provider about how often you should have regular mammograms. Talk with your health care provider about your test results, treatment options, and if necessary, the need for more tests. Vaccines  Your health care provider may recommend certain vaccines, such as: Influenza vaccine. This is recommended every year. Tetanus, diphtheria, and acellular pertussis (Tdap, Td) vaccine. You may need a Td booster every 10 years. Zoster vaccine. You may need this after age 5. Pneumococcal 13-valent conjugate (PCV13) vaccine.  One  dose is recommended after age 60. Pneumococcal polysaccharide (PPSV23) vaccine. One dose is recommended after age 11. Talk to your health care provider about which screenings and vaccines you need and how often you need them. This information is not intended to replace advice given to you by your health care provider. Make sure you discuss any questions you have with your health care provider. Document Released: 07/07/2015 Document Revised: 02/28/2016 Document Reviewed: 04/11/2015 Elsevier Interactive Patient Education  2017 [employer] Prevention in the Home Falls can cause injuries. They can happen to people of all ages. There are many things you can do to make your home safe and to help prevent falls. What can I do on the outside of my home? Regularly fix the edges of walkways and driveways and fix any cracks. Remove anything that might make you trip as you walk through a door, such as a raised step or threshold. Trim any bushes or trees on the path to your home. Use bright outdoor lighting. Clear any walking paths of anything that might make someone trip, such as rocks or tools. Regularly check to see if handrails are loose or broken. Make sure that both sides of any steps have handrails. Any raised decks and porches should have guardrails on the edges. Have any leaves, snow, or ice cleared regularly. Use sand or salt on walking paths during winter. Clean up any spills in your garage right away. This includes oil or grease spills. What can I do in the bathroom? Use night lights. Install grab bars by the toilet and in the tub and shower. Do not use towel bars as grab bars. Use non-skid mats or decals in the tub or shower. If you need to sit down in the shower, use a plastic, non-slip stool. Keep the floor dry. Clean up any water that spills on the floor as soon as it happens. Remove soap buildup in the tub or shower regularly. Attach bath mats securely with double-sided  non-slip rug tape. Do not have throw rugs and other things on the floor that can make you trip. What can I do in the bedroom? Use night lights. Make sure that you have a light by your bed that is easy to reach. Do not use any sheets or blankets that are too big for your bed. They should not hang down onto the floor. Have a firm chair that has side arms. You can use this for support while you get dressed. Do not have throw rugs and other things on the floor that can make you trip. What can I do in the kitchen? Clean up any spills right away. Avoid walking on wet floors. Keep items that you use a lot in easy-to-reach places. If you need to reach something above you, use a strong step stool that has a grab bar. Keep electrical cords out of the way. Do not use floor polish or wax that makes floors slippery. If you must use wax, use non-skid floor wax. Do not have throw rugs and other things on the floor that can make you trip. What can I do with my stairs? Do not leave any items on the stairs. Make sure that there are handrails on both sides of the stairs and use them. Fix handrails that are broken or loose. Make sure that handrails are as long as the stairways. Check any carpeting to make sure that it is firmly attached to the stairs. Fix any carpet that is loose or  worn. Avoid having throw rugs at the top or bottom of the stairs. If you do have throw rugs, attach them to the floor with carpet tape. Make sure that you have a light switch at the top of the stairs and the bottom of the stairs. If you do not have them, ask someone to add them for you. What else can I do to help prevent falls? Wear shoes that: Do not have high heels. Have rubber bottoms. Are comfortable and fit you well. Are closed at the toe. Do not wear sandals. If you use a stepladder: Make sure that it is fully opened. Do not climb a closed stepladder. Make sure that both sides of the stepladder are locked into place. Ask  someone to hold it for you, if possible. Clearly mark and make sure that you can see: Any grab bars or handrails. First and last steps. Where the edge of each step is. Use tools that help you move around (mobility aids) if they are needed. These include: Canes. Walkers. Scooters. Crutches. Turn on the lights when you go into a dark area. Replace any light bulbs as soon as they burn out. Set up your furniture so you have a clear path. Avoid moving your furniture around. If any of your floors are uneven, fix them. If there are any pets around you, be aware of where they are. Review your medicines with your doctor. Some medicines can make you feel dizzy. This can increase your chance of falling. Ask your doctor what other things that you can do to help prevent falls. This information is not intended to replace advice given to you by your health care provider. Make sure you discuss any questions you have with your health care provider. Document Released: 04/06/2009 Document Revised: 11/16/2015 Document Reviewed: 07/15/2014 Elsevier Interactive Patient Education  2017 [employer].

## 2021-07-17 NOTE — Progress Notes (Signed)
This visit occurred during the SARS-CoV-2 public health emergency.  Safety protocols were in place, including screening questions prior to the visit, additional usage of staff PPE, and extensive cleaning of exam room while observing appropriate contact time as indicated for disinfecting solutions.  Subjective:   Hannah Jennings is a 71 y.o. female who presents for Medicare Annual (Subsequent) preventive examination.  Review of Systems     Cardiac Risk Factors include: advanced age (>46men, >41 women);dyslipidemia     Objective:    Today's Vitals   07/17/21 1319  BP: 128/72  Pulse: 87  Temp: 98.1 F (36.7 C)  TempSrc: Oral  SpO2: 98%  Weight: 146 lb (66.2 kg)  Height: 5\' 4"  (1.626 m)   Body mass index is 25.06 kg/m.  Advanced Directives 07/17/2021 08/14/2020 07/21/2019  Does Patient Have a Medical Advance Directive? Yes Yes Yes  Type of Advance Directive Living will [place];Living will [place];Living will  Does patient want to make changes to medical advance directive? - No - Patient declined No - Patient declined  Copy of [place] in Chart? - No - copy requested No - copy requested    Current Medications (verified) Outpatient Encounter Medications as of 07/17/2021  Medication Sig   cholecalciferol (VITAMIN D) 1000 UNITS tablet Take 1,000 Units by mouth daily.    conjugated estrogens (PREMARIN) vaginal cream INSERT 1/2 GRAM IN THE VAGINA AT BEDTIME 2 TIMES A WEEK   desonide (DESOWEN) 0.05 % cream Apply topically 2 (two) times daily.   DULoxetine (CYMBALTA) 60 MG capsule TAKE 1 CAPSULE(60 MG) BY MOUTH DAILY   risedronate (ACTONEL) 150 MG tablet with water on empty stomach, nothing by mouth or lie down for next 30 minutes.   No facility-administered encounter medications on file as of 07/17/2021.    Allergies (verified) Patient has no known allergies.   History: Past Medical History:  Diagnosis Date    Cancer ([place]) 2018   basal cell--chest and left ear   Oral lichen planus    --hx of oral--pt. states for years   Osteoporosis 03-2012   T-score 1.5/2.8    Past Surgical History:  Procedure Laterality Date   RETINAL TEAR REPAIR CRYOTHERAPY Right    right foot surgery Right 03/23/2018   salivatory gland  1990   Blocked salivatory gland removed   SKIN CANCER EXCISION     TONSILLECTOMY AND ADENOIDECTOMY  Age 23   TUBAL LIGATION  1980   Family History  Problem Relation Age of Onset   Hypertension Mother    Atrial fibrillation Mother    Dementia Mother    Hypertension Father    Cancer Father        colon   Neuropathy Sister    Diabetes Maternal Grandmother    Cancer Paternal Grandmother    Social History   Socioeconomic History   Marital status: Married    Spouse name: Not on file   Number of children: 2   Years of education: 16   Highest education level: Bachelor's degree (e.g., BA, AB, BS)  Occupational History   Occupation: retired  Tobacco Use   Smoking status: Former    Types: Cigarettes    Quit date: 01/26/1988    Years since quitting: 33.4   Smokeless tobacco: Never  Vaping Use   Vaping Use: Never used  Substance and Sexual Activity   Alcohol use: Yes    Alcohol/week: 2.0 standard drinks    Types: 2 Glasses of  wine per week   Drug use: No   Sexual activity: Not Currently    Partners: Male    Birth control/protection: Surgical    Comment: BTL  Other Topics Concern   Not on file  Social History Narrative   HH 3   3 dogs   Retired Copywriter, advertising   2 grandchildren   Social Determinants of Radio broadcast assistant Strain: Low Risk    Difficulty of Paying Living Expenses: Not hard at all  Food Insecurity: No Food Insecurity   Worried About Charity fundraiser in the Last Year: Never true   Arboriculturist in the Last Year: Never true  Transportation Needs: No Transportation Needs   Lack of Transportation (Medical): No   Lack of Transportation (Non-Medical): No   Physical Activity: Inactive   Days of Exercise per Week: 0 days   Minutes of Exercise per Session: 0 min  Stress: No Stress Concern Present   Feeling of Stress : Not at all  Social Connections: Moderately Isolated   Frequency of Communication with Friends and Family: Three times a week   Frequency of Social Gatherings with Friends and Family: Once a week   Attends Religious Services: Never   Marine scientist or Organizations: No   Attends Music therapist: Never   Marital Status: Married    Tobacco Counseling Counseling given: Not Answered   Clinical Intake:  Pre-visit preparation completed: Yes  Pain : No/denies pain     Nutritional Status: BMI 25 -29 Overweight Nutritional Risks: None Diabetes: No  How often do you need to have someone help you when you read instructions, pamphlets, or other written materials from your doctor or pharmacy?: 1 - Never What is the last grade level you completed in school?: baachelor's degree nursing  Diabetic? no  Interpreter Needed?: No  Information entered by :: NAllen LPN   Activities of Daily Living In your present state of health, do you have any difficulty performing the following activities: 07/17/2021 07/14/2021  Hearing? N N  Vision? N N  Difficulty concentrating or making decisions? N N  Walking or climbing stairs? N N  Dressing or bathing? N N  Doing errands, shopping? N N  Preparing Food and eating ? N N  Using the Toilet? N N  In the past six months, have you accidently leaked urine? Y Y  Do you have problems with loss of bowel control? N N  Managing your Medications? N N  Managing your Finances? N N  Housekeeping or managing your Housekeeping? N N  Some recent data might be hidden    Patient Care Team: [place], Betty G, MD as PCP - General (Family Medicine) Nunzio Cobbs, MD as Consulting Physician (Obstetrics and Gynecology)  Indicate any recent Medical Services you may have  received from other than [facility] providers in the past year (date may be approximate).     Assessment:   This is a routine wellness examination for Hannah Jennings.  Hearing/Vision screen Vision Screening - Comments:: Regular eye exams, Dr. Delman Cheadle  Dietary issues and exercise activities discussed: Current Exercise Habits: The patient does not participate in regular exercise at present   Goals Addressed             This Visit's Progress    Patient Stated       07/17/2021, lose weight       Depression Screen PHQ 2/9 Scores 07/17/2021 08/28/2020 08/14/2020 07/21/2019 06/25/2017  PHQ -  2 Score 0 0 0 0 0  PHQ- 9 Score - 0 - - -    Fall Risk Fall Risk  07/17/2021 07/14/2021 08/14/2020 07/21/2019 06/25/2017  Falls in the past year? 0 0 0 0 No  Number falls in past yr: - 0 0 - -  Injury with Fall? - 0 0 - -  Risk for fall due to : Medication side effect - No Fall Risks Medication side effect -  Follow up Falls evaluation completed;Education provided;Falls prevention discussed - Falls evaluation completed;Falls prevention discussed Falls evaluation completed;Education provided;Falls prevention discussed -    FALL RISK PREVENTION PERTAINING TO THE HOME:  Any stairs in or around the home? Yes  If so, are there any without handrails? No  Home free of loose throw rugs in walkways, pet beds, electrical cords, etc? Yes  Adequate lighting in your home to reduce risk of falls? Yes   ASSISTIVE DEVICES UTILIZED TO PREVENT FALLS:  Life alert? No  Use of a cane, walker or w/c? No  Grab bars in the bathroom? Yes  Shower chair or bench in shower? No  Elevated toilet seat or a handicapped toilet? No   TIMED UP AND GO:  Was the test performed? No .    Gait steady and fast without use of assistive device  Cognitive Function:     6CIT Screen 07/17/2021 07/21/2019  What Year? 0 points 0 points  What month? 0 points 0 points  What time? 0 points 0 points  Count back from 20 0 points 0 points  Months in  reverse 0 points 0 points  Repeat phrase 0 points 0 points  Total Score 0 0    Immunizations Immunization History  Administered Date(s) Administered   Fluad Quad(high Dose 65+) 03/28/2021   Influenza, High Dose Seasonal PF 03/18/2016, 06/03/2017, 06/29/2018, 03/28/2019, 04/20/2020   Moderna Covid-19 Vaccine Bivalent Booster 14yrs & up 03/28/2021   Moderna Sars-Covid-2 Vaccination 08/07/2019, 09/04/2019, 04/20/2020   Pneumococcal Conjugate-13 08/14/2020   Pneumococcal Polysaccharide-23 06/25/2017   Tdap 12/08/2007, 12/24/2017   Zoster Recombinat (Shingrix) 07/02/2021    TDAP status: Up to date  Flu Vaccine status: Up to date  Pneumococcal vaccine status: Up to date  Covid-19 vaccine status: Completed vaccines  Qualifies for Shingles Vaccine? Yes   Zostavax completed No   Shingrix Completed?: need second dose  Screening Tests Health Maintenance  Topic Date Due   Zoster Vaccines- Shingrix (2 of 2) 08/27/2021   MAMMOGRAM  09/30/2022   COLONOSCOPY (Pts 45-15yrs Insurance coverage will need to be confirmed)  04/03/2026   TETANUS/TDAP  12/25/2027   Pneumonia Vaccine 25+ Years old  Completed   INFLUENZA VACCINE  Completed   DEXA SCAN  Completed   COVID-19 Vaccine  Completed   Hepatitis C Screening  Completed   HPV VACCINES  Aged Out    Health Maintenance  There are no preventive care reminders to display for this patient.   Colorectal cancer screening: Type of screening: Colonoscopy. Completed 04/03/2016. Repeat every 5 years  Mammogram status: Completed 09/29/2020. Repeat every year  Bone Density status: scheduled for March  Lung Cancer Screening: (Low Dose CT Chest recommended if Age 38-80 years, 30 pack-year currently smoking OR have quit w/in 15years.) does not qualify.   Lung Cancer Screening Referral: no  Additional Screening:  Hepatitis C Screening: does qualify; Completed 06/25/2017  Vision Screening: Recommended annual ophthalmology exams for early  detection of glaucoma and other disorders of the eye. Is the patient up to date  with their annual eye exam?  Yes  Who is the provider or what is the name of the office in which the patient attends annual eye exams? Dr. Delman Cheadle If pt is not established with a provider, would they like to be referred to a provider to establish care? No .   Dental Screening: Recommended annual dental exams for proper oral hygiene  Community Resource Referral / Chronic Care Management: CRR required this visit?  No   CCM required this visit?  No      Plan:     I have personally reviewed and noted the following in the patients chart:   Medical and social history Use of alcohol, tobacco or illicit drugs  Current medications and supplements including opioid prescriptions.  Functional ability and status Nutritional status Physical activity Advanced directives List of other physicians Hospitalizations, surgeries, and ER visits in previous 12 months Vitals Screenings to include cognitive, depression, and falls Referrals and appointments  In addition, I have reviewed and discussed with patient certain preventive protocols, quality metrics, and best practice recommendations. A written personalized care plan for preventive services as well as general preventive health recommendations were provided to patient.     Kellie Simmering, LPN   0/27/2536   Nurse Notes: none

## 2021-07-20 IMAGING — MG MM DIGITAL SCREENING BILAT W/ TOMO AND CAD
8 series · 8 of 24 positions shown · non-contrast
Comparison: Previous exam(s).

CLINICAL DATA: Screening.

EXAM:
DIGITAL SCREENING BILATERAL MAMMOGRAM WITH TOMOSYNTHESIS AND CAD
TECHNIQUE: Bilateral screening digital craniocaudal and mediolateral oblique
mammograms were obtained. Bilateral screening digital breast
tomosynthesis was performed. The images were evaluated with
computer-aided detection.

[L CC synth-2D]
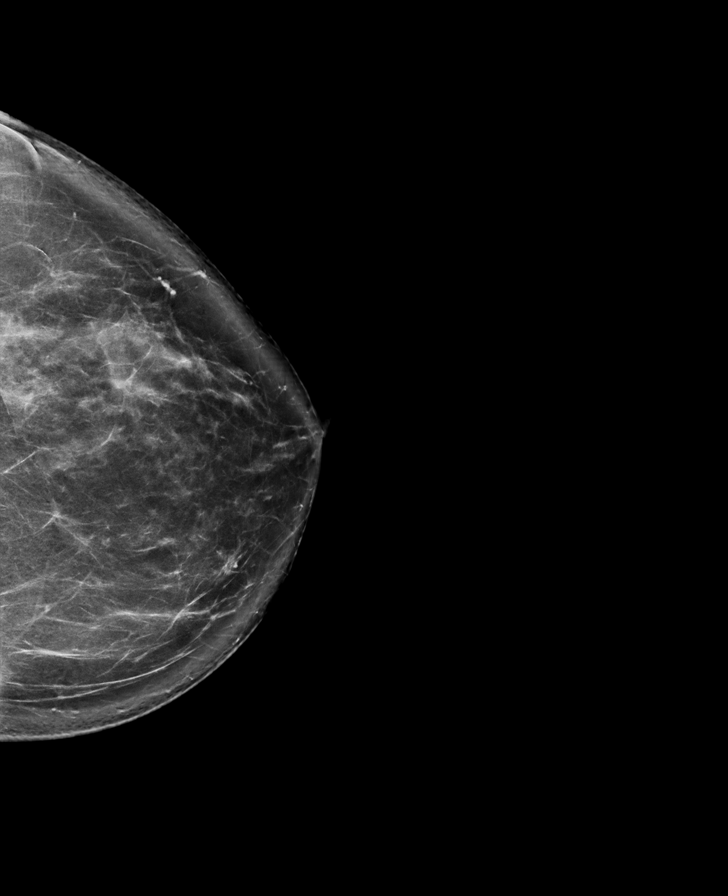

[R MLO synth-2D]
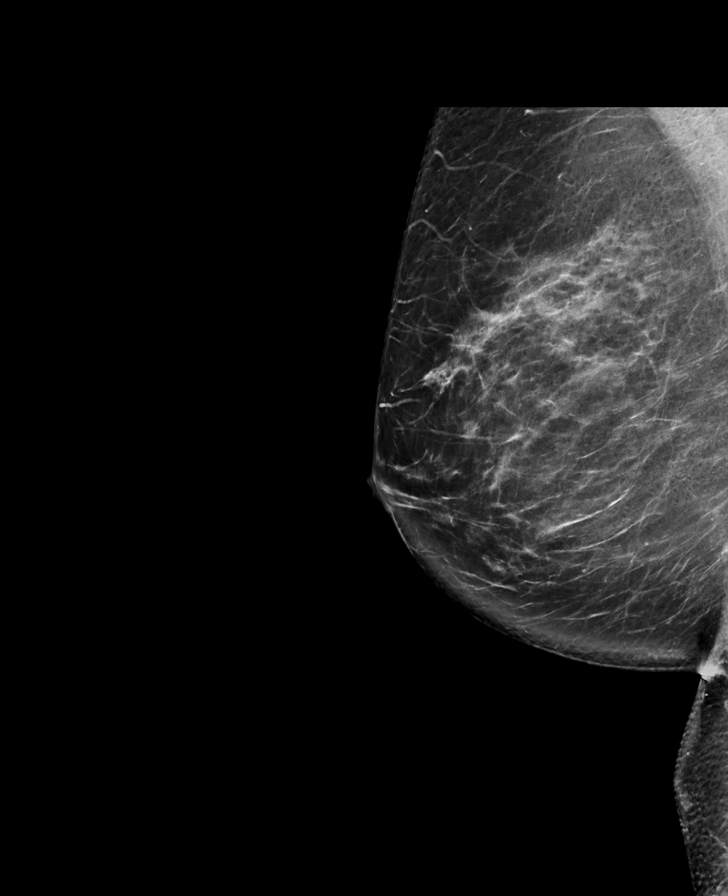

[L MLO synth-2D]
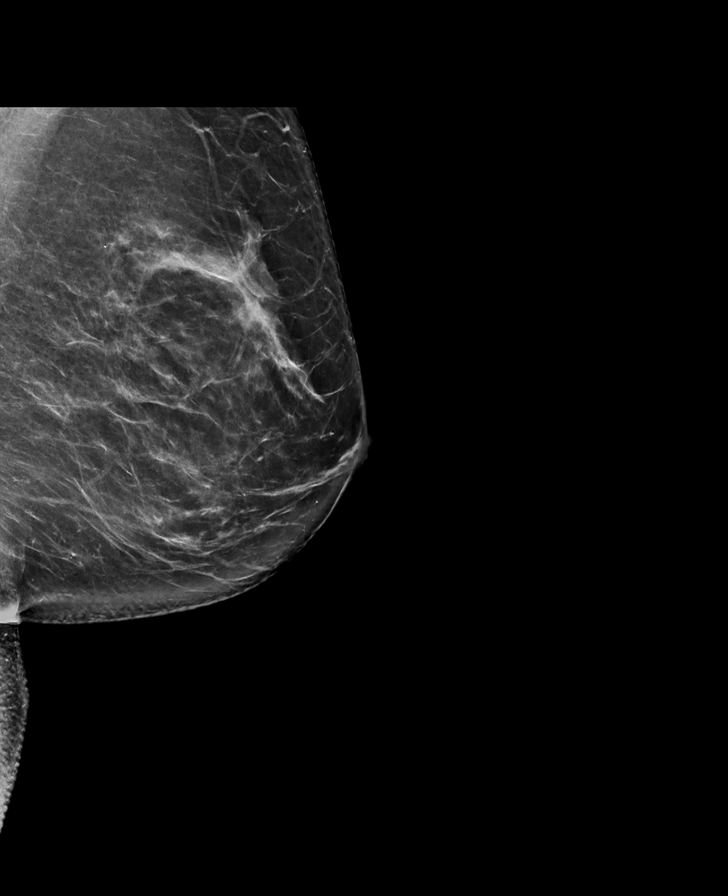

[R CC synth-2D]
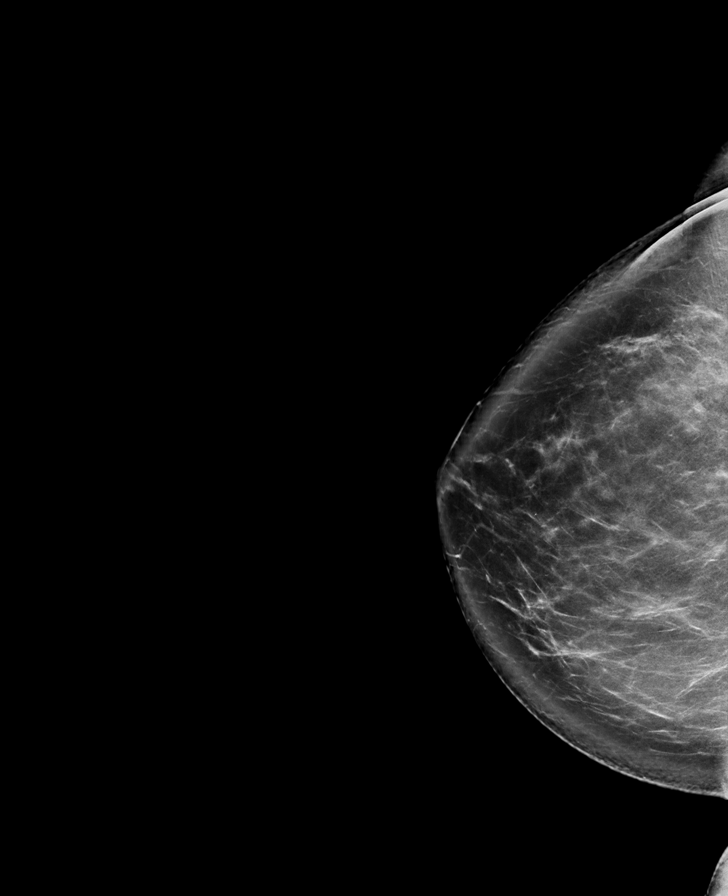

[R CC tomo · tomo slice 49/98.0]
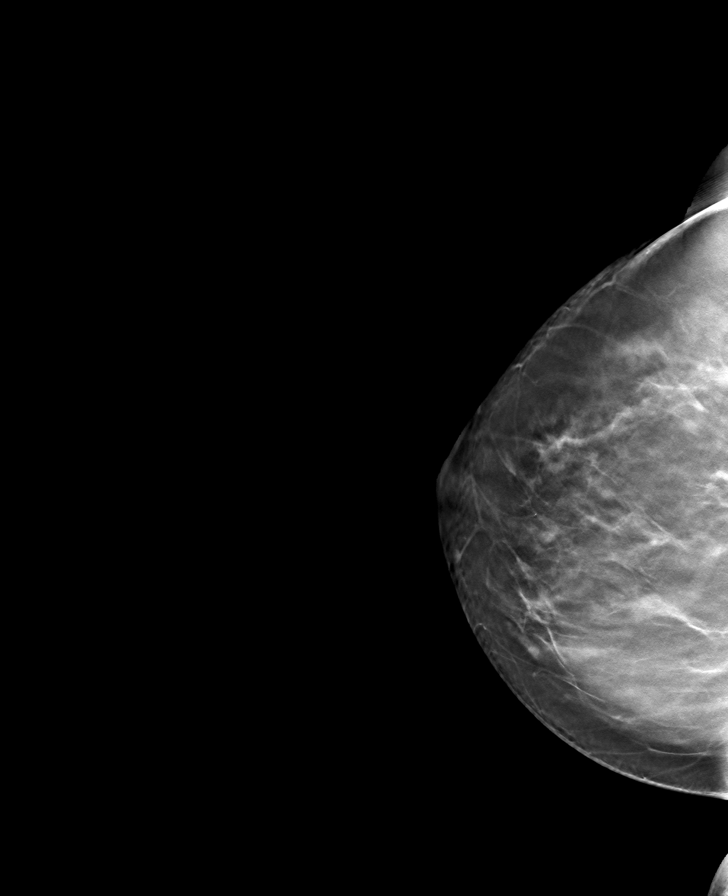

[L CC tomo · tomo slice 47/92.0]
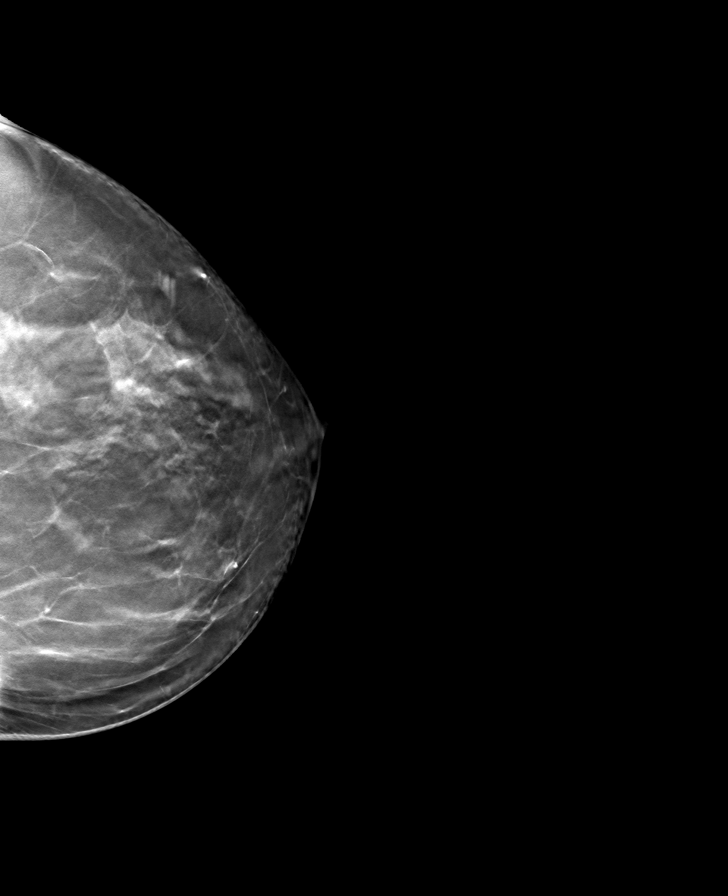

[L MLO tomo · tomo slice 45/90.0]
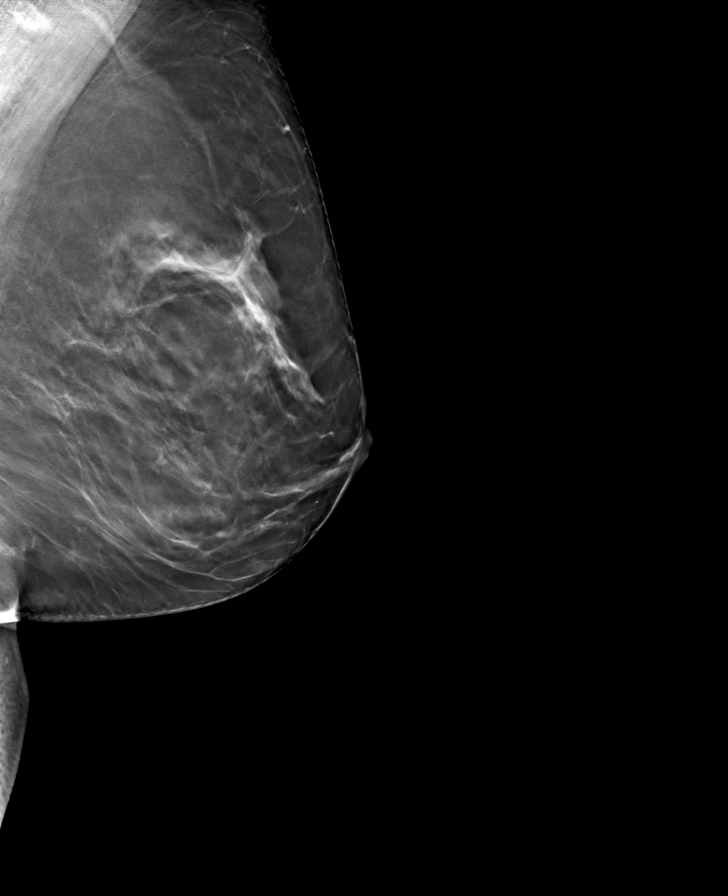

[R MLO tomo · tomo slice 46/91.0]
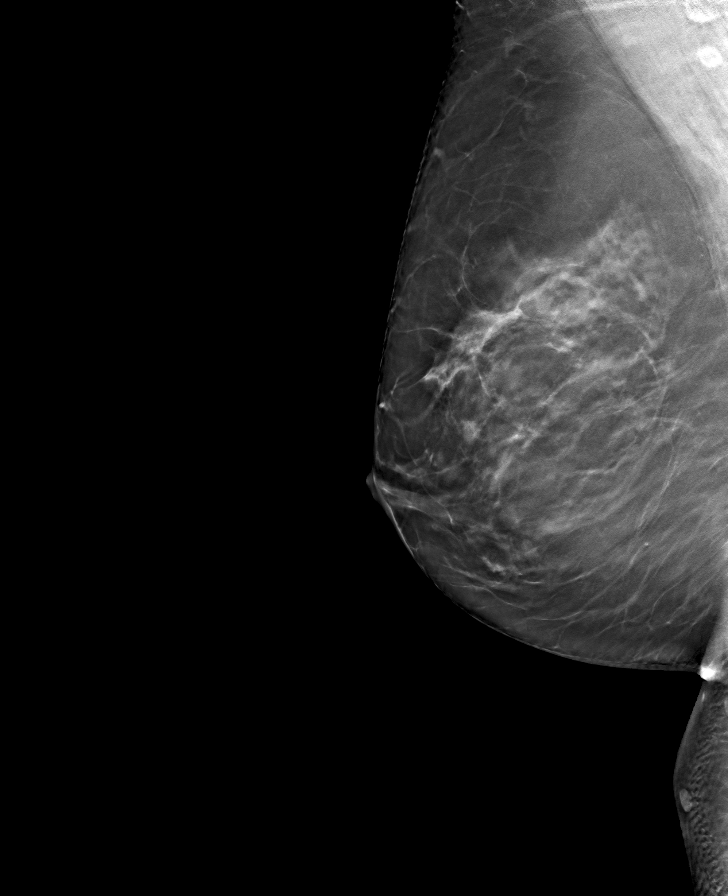

[8 of 24 positions shown; findings below may reference images not displayed]

ACR Breast Density Category b: There are scattered areas of
fibroglandular density.
FINDINGS: There are no findings suspicious for malignancy. The images were
evaluated with computer-aided detection.
IMPRESSION: No mammographic evidence of malignancy. A result letter of this
screening mammogram will be mailed directly to the patient.

RECOMMENDATION:
Screening mammogram in one year. (Code:[id number])

BI-RADS CATEGORY  1: Negative.

And a please Insert Correct Screening Template

## 2021-07-26 ENCOUNTER — Other Ambulatory Visit: Payer: Self-pay | Admitting: Obstetrics and Gynecology

## 2021-07-26 NOTE — Telephone Encounter (Signed)
AEX 05/02/2021.

## 2021-08-27 ENCOUNTER — Other Ambulatory Visit: Payer: Self-pay

## 2021-08-27 ENCOUNTER — Ambulatory Visit
Admission: RE | Admit: 2021-08-27 | Discharge: 2021-08-27 | Disposition: A | Discharge status: Home / Self Care | Payer: Medicare Other | Source: Ambulatory Visit | Attending: Obstetrics and Gynecology | Admitting: Obstetrics and Gynecology

## 2021-08-27 DIAGNOSIS — Z8739 Personal history of other diseases of the musculoskeletal system and connective tissue: Secondary | ICD-10-CM

## 2021-08-27 DIAGNOSIS — Z7983 Long term (current) use of bisphosphonates: Secondary | ICD-10-CM

## 2021-08-27 DIAGNOSIS — Z780 Asymptomatic menopausal state: Secondary | ICD-10-CM

## 2021-10-05 ENCOUNTER — Other Ambulatory Visit: Payer: Self-pay | Admitting: Family Medicine

## 2021-10-05 DIAGNOSIS — F330 Major depressive disorder, recurrent, mild: Secondary | ICD-10-CM

## 2021-10-05 DIAGNOSIS — M25562 Pain in left knee: Secondary | ICD-10-CM

## 2021-10-30 NOTE — Progress Notes (Signed)
? ? ?ACUTE VISIT ?Chief Complaint  ?Patient presents with  ? Hypertension  ? ?HPI: ?Hannah Jennings is a 71 y.o. female, who is here today concerned about elevated BP. ?No prior hx of HTN. ?Elevated BP during home health visit, 140/90 on 10/04/21. ?She has been monitoring BP regularly after visit:130's-150's/90-108. A few DBP in the 80's, seldom 70's. ?HR's 90-100's,108 and 121 (after gardening). ?No new medications or OTC wt loss supplements,herbs,or cold meds. ?No new stressors. ?Both parents with hx of HTN. ? ?Hypertension ?This is a new problem. The current episode started 1 to 4 weeks ago. The problem is unchanged. The problem is uncontrolled. Pertinent negatives include no anxiety, blurred vision, chest pain, headaches, malaise/fatigue, neck pain, orthopnea, palpitations, peripheral edema, PND, shortness of breath or sweats. There are no associated agents to hypertension. Risk factors for coronary artery disease include dyslipidemia and post-menopausal state. Past treatments include nothing. There is no history of kidney disease, CAD/MI, CVA, heart failure or PVD.  ?Negative for gross hematuria,foam in urine,or decreased urine output. ?No known hx of OSA. ? ?Lab Results  ?Component Value Date  ? CREATININE 0.82 08/28/2020  ? BUN 14 08/28/2020  ? NA 139 08/28/2020  ? K 4.7 08/28/2020  ? CL 103 08/28/2020  ? CO2 30 08/28/2020  ? ?For the past few weeks she is trying to eat healthier. ? ?Review of Systems  ?Constitutional:  Negative for activity change, appetite change, chills, fever and malaise/fatigue.  ?Eyes:  Negative for blurred vision, redness and visual disturbance.  ?Respiratory:  Negative for cough and shortness of breath.   ?Cardiovascular:  Negative for chest pain, palpitations, orthopnea and PND.  ?Endocrine: Negative for cold intolerance and heat intolerance.  ?Musculoskeletal:  Negative for gait problem and neck pain.  ?Neurological:  Negative for syncope, weakness and headaches.  ?Rest see  pertinent positives and negatives per HPI. ? ?Current Outpatient Medications on File Prior to Visit  ?Medication Sig Dispense Refill  ? cholecalciferol (VITAMIN D) 1000 UNITS tablet Take 1,000 Units by mouth daily.     ? conjugated estrogens (PREMARIN) vaginal cream INSERT 1/2 GRAM IN THE VAGINA AT BEDTIME 2 TIMES A WEEK 30 g 1  ? desonide (DESOWEN) 0.05 % cream Apply topically 2 (two) times daily. 30 g 1  ? DULoxetine (CYMBALTA) 60 MG capsule TAKE 1 CAPSULE(60 MG) BY MOUTH DAILY 90 capsule 0  ? risedronate (ACTONEL) 150 MG tablet TAKE 1 TABLET BY MOUTH EVERY MONTH WITH WATER AND ON AN EMPTY STOMACH. DO NOT LIE DOWN FOR NEXT 30 MINUTES AFTER TAKING* 3 tablet 2  ? ?No current facility-administered medications on file prior to visit.  ? ?Past Medical History:  ?Diagnosis Date  ? Cancer [facility]) 2018  ? basal cell--chest and left ear  ? Oral lichen planus   ? --hx of oral--pt. states for years  ? Osteoporosis 03-2012  ? T-score 1.5/2.8   ? ?No Known Allergies ? ?Social History  ? ?Socioeconomic History  ? Marital status: Married  ?  Spouse name: Not on file  ? Number of children: 2  ? Years of education: 52  ? Highest education level: Bachelor's degree (e.g., BA, AB, BS)  ?Occupational History  ? Occupation: retired  ?Tobacco Use  ? Smoking status: Former  ?  Types: Cigarettes  ?  Quit date: 01/26/1988  ?  Years since quitting: 33.7  ? Smokeless tobacco: Never  ?Vaping Use  ? Vaping Use: Never used  ?Substance and Sexual Activity  ? Alcohol use:  Yes  ?  Alcohol/week: 2.0 standard drinks  ?  Types: 2 Glasses of wine per week  ? Drug use: No  ? Sexual activity: Not Currently  ?  Partners: Male  ?  Birth control/protection: Surgical  ?  Comment: BTL  ?Other Topics Concern  ? Not on file  ?Social History Narrative  ? HH 3  ? 3 dogs  ? Retired Copywriter, advertising  ? 2 grandchildren  ? ?Social Determinants of Health  ? ?Financial Resource Strain: Low Risk   ? Difficulty of Paying Living Expenses: Not hard at all  ?Food Insecurity: No Food  Insecurity  ? Worried About Charity fundraiser in the Last Year: Never true  ? Ran Out of Food in the Last Year: Never true  ?Transportation Needs: No Transportation Needs  ? Lack of Transportation (Medical): No  ? Lack of Transportation (Non-Medical): No  ?Physical Activity: Inactive  ? Days of Exercise per Week: 0 days  ? Minutes of Exercise per Session: 0 min  ?Stress: No Stress Concern Present  ? Feeling of Stress : Not at all  ?Social Connections: Moderately Isolated  ? Frequency of Communication with Friends and Family: Twice a week  ? Frequency of Social Gatherings with Friends and Family: Once a week  ? Attends Religious Services: Never  ? Active Member of Clubs or Organizations: No  ? Attends Archivist Meetings: Not on file  ? Marital Status: Married  ? ?Vitals:  ? 10/31/21 0942 10/31/21 0945  ?BP: 138/90 138/90  ?Pulse: 82   ?Resp: 16   ?Temp: 98.3 ?F (36.8 ?C)   ?SpO2: 98%   ? ?Body mass index is 25.06 kg/m?. ? ?Physical Exam ?Vitals and nursing note reviewed.  ?Constitutional:   ?   General: She is not in acute distress. ?   Appearance: She is well-developed.  ?HENT:  ?   Head: Normocephalic and atraumatic.  ?   Mouth/Throat:  ?   Mouth: Mucous membranes are moist.  ?   Pharynx: Oropharynx is clear.  ?Eyes:  ?   Conjunctiva/sclera: Conjunctivae normal.  ?Cardiovascular:  ?   Rate and Rhythm: Normal rate and regular rhythm.  ?   Pulses:     ?     Dorsalis pedis pulses are 2+ on the right side and 2+ on the left side.  ?   Heart sounds: No murmur heard. ?Pulmonary:  ?   Effort: Pulmonary effort is normal. No respiratory distress.  ?   Breath sounds: Normal breath sounds.  ?Abdominal:  ?   Palpations: Abdomen is soft. There is no hepatomegaly or mass.  ?   Tenderness: There is no abdominal tenderness.  ?Lymphadenopathy:  ?   Cervical: No cervical adenopathy.  ?Skin: ?   General: Skin is warm.  ?   Findings: No erythema or rash.  ?Neurological:  ?   General: No focal deficit present.  ?   Mental  Status: She is alert and oriented to person, place, and time.  ?   Cranial Nerves: No cranial nerve deficit.  ?   Gait: Gait normal.  ?Psychiatric:  ?   Comments: Well groomed, good eye contact.  ? ?ASSESSMENT AND PLAN: ? ?[name] was seen today for hypertension. ? ?Diagnoses and all orders for this visit: ?Orders Placed This Encounter  ?Procedures  ? Basic metabolic panel  ? TSH  ? ?Essential (primary) hypertension ?New Dx. ?We discussed criteria Dx for HTN, current guidelines,prognosis,and treatment options. ?Several BP's at home  150's/90's. ?Reviewed pharmacologic treatment options and side effects. ?She agrees with starting Benazepril 10 mg, starting with 1/2 tab and increasing to whole if well tolerated. ?Low salt/DASH diet recommended. ?Continue monitoring BP. ?BMP and TSH in 7-10 days. ? ?-     Benazepril (LOTENSIN) 10 MG tablet; Take 1 tablet (10 mg total) by mouth daily. ? ?Overweight (BMI 25.0-29.9) ?We discussed benefits of wt loss. ?Consistency with healthy diet and physical activity encouraged. ? ?Return in about 4 weeks (around 11/28/2021) for lab in 7-10 days. ? ?Dionisia Pacholski G. Martinique, MD ? ?[place]. ?[place] office. ? ?

## 2021-10-31 ENCOUNTER — Ambulatory Visit (INDEPENDENT_AMBULATORY_CARE_PROVIDER_SITE_OTHER): Payer: Medicare Other | Admitting: Family Medicine

## 2021-10-31 ENCOUNTER — Other Ambulatory Visit: Payer: Self-pay | Admitting: Family Medicine

## 2021-10-31 ENCOUNTER — Encounter: Payer: Self-pay | Admitting: Family Medicine

## 2021-10-31 VITALS — BP 138/90 | HR 82 | Temp 98.3°F | Resp 16 | Ht 64.0 in | Wt 146.0 lb

## 2021-10-31 DIAGNOSIS — I10 Essential (primary) hypertension: Secondary | ICD-10-CM

## 2021-10-31 DIAGNOSIS — E663 Overweight: Secondary | ICD-10-CM | POA: Diagnosis not present

## 2021-10-31 MED ORDER — BENAZEPRIL HCL 10 MG PO TABS: 10.0000 mg | ORAL_TABLET | Freq: Every day | ORAL | 1 refills | Status: DC

## 2021-10-31 NOTE — Patient Instructions (Signed)
A few things to remember from today's visit: ? ? ?Essential (primary) hypertension - Plan: benazepril (LOTENSIN) 10 MG tablet ? ?If you need refills please call your pharmacy. ?Do not use My Chart to request refills or for acute issues that need immediate attention. ?  ?Today we are starting Benazepril 10 mg, start 5 mg daily and in a week you can increase to the whole tab. ?Lab in 7-10 days. ?Monitor blood pressure regularly. ? ?Please be sure medication list is accurate. ?If a new problem present, please set up appointment sooner than planned today. ? ? ? ? ? ? ? ?

## 2021-11-03 ENCOUNTER — Encounter: Payer: Self-pay | Admitting: Family Medicine

## 2021-11-09 ENCOUNTER — Other Ambulatory Visit (INDEPENDENT_AMBULATORY_CARE_PROVIDER_SITE_OTHER): Payer: Medicare Other

## 2021-11-09 DIAGNOSIS — I10 Essential (primary) hypertension: Secondary | ICD-10-CM | POA: Diagnosis not present

## 2021-11-09 LAB — BASIC METABOLIC PANEL
BUN: 13 mg/dL (ref 6–23)
CO2: 28 mEq/L (ref 19–32)
Calcium: 8.9 mg/dL (ref 8.4–10.5)
Chloride: 105 mEq/L (ref 96–112)
Creatinine, Ser: 0.86 mg/dL (ref 0.40–1.20)
GFR: 67.79 mL/min (ref >60.00)
Glucose, Bld: 107 mg/dL — ABNORMAL HIGH (ref 70–99)
Potassium: 4.4 mEq/L (ref 3.5–5.1)
Sodium: 140 mEq/L (ref 135–145)

## 2021-11-09 LAB — TSH: TSH: 2.26 u[IU]/mL (ref 0.35–5.50)

## 2021-11-27 NOTE — Progress Notes (Signed)
[name] is a 71 y.o.female, who is here today for follow up.  Last follow up visit: 10/31/21 Hypertension:  Medications: Benazepril 10 mg daily. BP readings at home:120's-140's/60-70's.Most SBP's 120=130's, 157 x 1 and 115 x 1. Side effects:None. Negative for unusual or severe headache, visual changes, exertional chest pain, dyspnea,  focal weakness, or edema.  Lab Results  Component Value Date   CREATININE 0.86 11/09/2021   BUN 13 11/09/2021   NA 140 11/09/2021   K 4.4 11/09/2021   CL 105 11/09/2021   CO2 28 11/09/2021   Depression: She is not having symptoms. She is on Duloxetine 60 mg daily, which has also help with LE achy pain and arthralgias.     11/28/2021   10:03 AM 10/31/2021   10:02 AM 07/17/2021    1:28 PM 08/28/2020    1:10 PM 08/14/2020    8:41 AM  Depression screen PHQ 2/9  Decreased Interest 0 0 0 0 0  Down, Depressed, Hopeless 0 0 0 0 0  PHQ - 2 Score 0 0 0 0 0  Altered sleeping 0 0  0   Tired, decreased energy 0 0  0   Change in appetite 0 0  0   Feeling bad or failure about yourself  0 0  0   Trouble concentrating 0 0  0   Moving slowly or fidgety/restless 0 0  0   Suicidal thoughts 0 0  0   PHQ-9 Score 0 0  0   Difficult doing work/chores    Not difficult at all    HLD: She is on non pharmacologic treatment.  Lab Results  Component Value Date   CHOL 216 (H) 08/28/2020   HDL 66.00 08/28/2020   LDLCALC 128 (H) 08/28/2020   TRIG 111.0 08/28/2020   CHOLHDL 3 08/28/2020   Review of Systems  Constitutional:  Negative for activity change, appetite change and fever.  HENT:  Negative for mouth sores, nosebleeds and sore throat.   Respiratory:  Negative for cough and wheezing.   Gastrointestinal:  Negative for abdominal pain, nausea and vomiting.       Negative for changes in bowel habits.  Genitourinary:  Negative for decreased urine volume and hematuria.  Neurological:  Negative for syncope, facial asymmetry and weakness.  Rest see  pertinent positives and negatives per HPI.  Current Outpatient Medications on File Prior to Visit  Medication Sig Dispense Refill   benazepril (LOTENSIN) 10 MG tablet TAKE 1 TABLET(10 MG) BY MOUTH DAILY 90 tablet 0   cholecalciferol (VITAMIN D) 1000 UNITS tablet Take 1,000 Units by mouth daily.      conjugated estrogens (PREMARIN) vaginal cream INSERT 1/2 GRAM IN THE VAGINA AT BEDTIME 2 TIMES A WEEK 30 g 1   desonide (DESOWEN) 0.05 % cream Apply topically 2 (two) times daily. 30 g 1   risedronate (ACTONEL) 150 MG tablet TAKE 1 TABLET BY MOUTH EVERY MONTH WITH WATER AND ON AN EMPTY STOMACH. DO NOT LIE DOWN FOR NEXT 30 MINUTES AFTER TAKING* 3 tablet 2   No current facility-administered medications on file prior to visit.   Past Medical History:  Diagnosis Date   Cancer ([place]) 2018   basal cell--chest and left ear   Oral lichen planus    --hx of oral--pt. states for years   Osteoporosis 03-2012   T-score 1.5/2.8    No Known Allergies  Social History   Socioeconomic History   Marital status: Married    Spouse name:  Not on file   Number of children: 2   Years of education: 16   Highest education level: Bachelor's degree (e.g., BA, AB, BS)  Occupational History   Occupation: retired  Tobacco Use   Smoking status: Former    Types: Cigarettes    Quit date: 01/26/1988    Years since quitting: 33.8   Smokeless tobacco: Never  Vaping Use   Vaping Use: Never used  Substance and Sexual Activity   Alcohol use: Yes    Alcohol/week: 2.0 standard drinks of alcohol    Types: 2 Glasses of wine per week   Drug use: No   Sexual activity: Not Currently    Partners: Male    Birth control/protection: Surgical    Comment: BTL  Other Topics Concern   Not on file  Social History Narrative   [place] [address]   3 dogs   Retired Copywriter, advertising   2 grandchildren   Social Determinants of Health   Financial Resource Strain: Low Risk  (10/29/2021)   Overall Financial Resource Strain (CARDIA)    Difficulty of Paying  Living Expenses: Not hard at all  Food Insecurity: No Food Insecurity (10/29/2021)   Hunger Vital Sign    Worried About Running Out of Food in the Last Year: Never true    [place] in the Last Year: Never true  Transportation Needs: No Transportation Needs (10/29/2021)   PRAPARE - Hydrologist (Medical): No    Lack of Transportation (Non-Medical): No  Physical Activity: Inactive (10/29/2021)   Exercise Vital Sign    Days of Exercise per Week: 0 days    Minutes of Exercise per Session: 0 min  Stress: No Stress Concern Present (10/29/2021)   [place]    Feeling of Stress : Not at all  Social Connections: Moderately Isolated (10/29/2021)   Social Connection and Isolation Panel [NHANES]    Frequency of Communication with Friends and Family: Twice a week    Frequency of Social Gatherings with Friends and Family: Once a week    Attends Religious Services: Never    Marine scientist or Organizations: No    Attends Archivist Meetings: Not on file    Marital Status: Married   Vitals:   11/28/21 0950  BP: 124/70  Pulse: 94  Resp: 16  SpO2: 97%   Body mass index is 24.78 kg/m.  Physical Exam Vitals and nursing note reviewed.  Constitutional:      General: She is not in acute distress.    Appearance: She is well-developed.  HENT:     Head: Normocephalic and atraumatic.  Eyes:     Conjunctiva/sclera: Conjunctivae normal.  Cardiovascular:     Rate and Rhythm: Normal rate and regular rhythm.     Pulses:          Dorsalis pedis pulses are 2+ on the right side and 2+ on the left side.     Heart sounds: No murmur heard. Pulmonary:     Effort: Pulmonary effort is normal. No respiratory distress.     Breath sounds: Normal breath sounds.  Abdominal:     Palpations: Abdomen is soft. There is no hepatomegaly or mass.     Tenderness: There is no abdominal tenderness.  Skin:     General: Skin is warm.     Findings: No erythema or rash.  Neurological:     General: No focal deficit present.  Mental Status: She is alert and oriented to person, place, and time.     Cranial Nerves: No cranial nerve deficit.     Gait: Gait normal.  Psychiatric:     Comments: Well groomed, good eye contact.   ASSESSMENT AND PLAN:   [name] was seen today for follow-up.  Diagnoses and all orders for this visit:  Hyperlipidemia She is not fasting today. Continue non pharmacologic treatment. Will plan on checking FLP next visit.  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 At some point we can start weaning off medication. She is ready to go for vacation, so will hold on dose changes for now.  Arthralgia of both lower legs Problem is well controlled since she has been on Cymbalta 60 mg. She would like to try to wean off medication at some point bit will continue same dose for now.  Essential (primary) hypertension BP adequately controlled today. Most home BP's at goal, so continue Benazepril 10 mg daily. Continue monitoring BP at home, can do so weekly. Low salt/DASH diet to continue. F/U in 5-6 months.  Return in about 6 months (around 05/30/2022) for cpe and f/u.  Jacquel Mccamish G. Martinique, MD  [facility] [place] office.

## 2021-11-28 ENCOUNTER — Encounter: Payer: Self-pay | Admitting: Family Medicine

## 2021-11-28 ENCOUNTER — Ambulatory Visit (INDEPENDENT_AMBULATORY_CARE_PROVIDER_SITE_OTHER): Payer: Medicare Other | Admitting: Family Medicine

## 2021-11-28 VITALS — BP 124/70 | HR 94 | Resp 16 | Ht 64.0 in | Wt 144.4 lb

## 2021-11-28 DIAGNOSIS — I10 Essential (primary) hypertension: Secondary | ICD-10-CM | POA: Diagnosis not present

## 2021-11-28 DIAGNOSIS — M25561 Pain in right knee: Secondary | ICD-10-CM

## 2021-11-28 DIAGNOSIS — F3341 Major depressive disorder, recurrent, in partial remission: Secondary | ICD-10-CM

## 2021-11-28 DIAGNOSIS — F330 Major depressive disorder, recurrent, mild: Secondary | ICD-10-CM

## 2021-11-28 DIAGNOSIS — E782 Mixed hyperlipidemia: Secondary | ICD-10-CM

## 2021-11-28 DIAGNOSIS — M25562 Pain in left knee: Secondary | ICD-10-CM

## 2021-11-28 MED ORDER — DULOXETINE HCL 60 MG PO CPEP: 60.0000 mg | ORAL_CAPSULE | Freq: Every day | ORAL | 1 refills | Status: DC

## 2021-11-28 NOTE — Assessment & Plan Note (Signed)
BP adequately controlled today. Most home BP's at goal, so continue Benazepril 10 mg daily. Continue monitoring BP at home, can do so weekly. Low salt/DASH diet to continue. F/U in 5-6 months.

## 2021-11-28 NOTE — Assessment & Plan Note (Signed)
Problem is well controlled since she has been on Cymbalta 60 mg. She would like to try to wean off medication at some point bit will continue same dose for now.

## 2021-11-28 NOTE — Assessment & Plan Note (Signed)
In partial remission. At some point we can start weaning off medication. She is ready to go for vacation, so will hold on dose changes for now.

## 2021-11-28 NOTE — Patient Instructions (Signed)
A few things to remember from today's visit:  Essential (primary) hypertension  Recurrent major depressive disorder, in partial remission ([place]), Chronic  Mixed hyperlipidemia  If you need refills please call your pharmacy. Do not use My Chart to request refills or for acute issues that need immediate attention.   No changes today. If you want to start weaning off Cymbalta let me know. Will plan on checking cholesterol next visit.  Please be sure medication list is accurate. If a new problem present, please set up appointment sooner than planned today.

## 2021-11-28 NOTE — Assessment & Plan Note (Signed)
She is not fasting today. Continue non pharmacologic treatment. Will plan on checking FLP next visit.

## 2021-11-29 MED ORDER — BENAZEPRIL HCL 10 MG PO TABS: ORAL_TABLET | ORAL | 1 refills | Status: DC

## 2022-01-15 ENCOUNTER — Other Ambulatory Visit: Payer: Self-pay | Admitting: *Deleted

## 2022-01-15 DIAGNOSIS — Z780 Asymptomatic menopausal state: Secondary | ICD-10-CM

## 2022-01-15 NOTE — Telephone Encounter (Signed)
Please confirm the patient's medication request.

## 2022-01-15 NOTE — Telephone Encounter (Signed)
Last annual exam 11/22 Scheduled on 05/08/22 Last mammogram 09/2020

## 2022-01-16 ENCOUNTER — Other Ambulatory Visit: Payer: Self-pay

## 2022-01-16 DIAGNOSIS — F3341 Major depressive disorder, recurrent, in partial remission: Secondary | ICD-10-CM

## 2022-01-16 DIAGNOSIS — M25561 Pain in right knee: Secondary | ICD-10-CM

## 2022-01-16 MED ORDER — DULOXETINE HCL 60 MG PO CPEP: 60.0000 mg | ORAL_CAPSULE | Freq: Every day | ORAL | 1 refills | Status: DC

## 2022-03-01 ENCOUNTER — Ambulatory Visit (INDEPENDENT_AMBULATORY_CARE_PROVIDER_SITE_OTHER): Payer: Medicare Other | Admitting: Family Medicine

## 2022-03-01 ENCOUNTER — Encounter: Payer: Self-pay | Admitting: Family Medicine

## 2022-03-01 VITALS — BP 124/68 | HR 90 | Temp 97.9°F | Ht 64.0 in | Wt 144.3 lb

## 2022-03-01 DIAGNOSIS — R059 Cough, unspecified: Secondary | ICD-10-CM

## 2022-03-01 DIAGNOSIS — R0981 Nasal congestion: Secondary | ICD-10-CM | POA: Diagnosis not present

## 2022-03-01 LAB — POC COVID19 BINAXNOW: SARS Coronavirus 2 Ag: NEGATIVE

## 2022-03-01 MED ORDER — AMOXICILLIN-POT CLAVULANATE 875-125 MG PO TABS: 1.0000 | ORAL_TABLET | Freq: Two times a day (BID) | ORAL | 0 refills | Status: DC

## 2022-03-01 NOTE — Progress Notes (Signed)
Established Patient Office Visit  Subjective   Patient ID: Hannah Jennings, female    DOB: 1951/03/06  Age: 71 y.o. MRN: [id number]  Chief Complaint  Patient presents with   Cough    Patient complains of cough, Productive cough with yellow sputum    Headache   Facial Pain    Patient complains of facial pain, x3 days,    Nasal Congestion    Patient complains of nasal congestion, x3 days, Tried Tylenol and Mucinex with little relief    HPI   Hannah Jennings is seen with concern for acute sinusitis.  Symptoms only started a few days ago but she describes some headaches, facial pain, malaise.  She states this feels much different than her typical cold.  She has tried Mucinex and Tylenol with minimal relief.  Has also had some productive cough with yellow sputum.  COVID screen negative.  Past Medical History:  Diagnosis Date   Cancer ([place]) 2018   basal cell--chest and left ear   Oral lichen planus    --hx of oral--pt. states for years   Osteoporosis 03-2012   T-score 1.5/2.8    Past Surgical History:  Procedure Laterality Date   RETINAL TEAR REPAIR CRYOTHERAPY Right    right foot surgery Right 03/23/2018   salivatory gland  1990   Blocked salivatory gland removed   SKIN CANCER EXCISION     TONSILLECTOMY AND ADENOIDECTOMY  Age 17   TUBAL LIGATION  1980    reports that she quit smoking about 34 years ago. Her smoking use included cigarettes. She has never used smokeless tobacco. She reports current alcohol use of about 2.0 standard drinks of alcohol per week. She reports that she does not use drugs. family history includes Atrial fibrillation in her mother; Cancer in her father and paternal grandmother; Dementia in her mother; Diabetes in her maternal grandmother; Hypertension in her father and mother; Neuropathy in her sister. No Known Allergies  Review of Systems  Constitutional:  Negative for chills and fever.  HENT:  Positive for congestion and sinus pain. Negative for ear  pain.   Respiratory:  Positive for cough. Negative for shortness of breath.   Neurological:  Positive for headaches.      Objective:     BP 124/68 (BP Location: Left Arm, Patient Position: Sitting, Cuff Size: Normal)   Pulse 90   Temp 97.9 F (36.6 C) (Oral)   Ht '5\' 4"'$  (1.626 m)   Wt 144 lb 4.8 oz (65.5 kg)   LMP 06/24/2002 (Approximate)   SpO2 98%   BMI 24.77 kg/m    Physical Exam Vitals reviewed.  Constitutional:      General: She is not in acute distress.    Appearance: She is well-developed. She is not ill-appearing.  HENT:     Head: Normocephalic and atraumatic.  Cardiovascular:     Rate and Rhythm: Normal rate and regular rhythm.  Pulmonary:     Effort: Pulmonary effort is normal.     Breath sounds: Normal breath sounds. No wheezing or rales.  Musculoskeletal:     Cervical back: Neck supple.  Lymphadenopathy:     Cervical: No cervical adenopathy.  Neurological:     Mental Status: She is alert.      Results for orders placed or performed in visit on 03/01/22  POC COVID-19  Result Value Ref Range   SARS Coronavirus 2 Ag Negative Negative      The 10-year ASCVD risk score (Arnett DK, et al.,  2019) is: 14.6%    Assessment & Plan:   Problem List Items Addressed This Visit   None Visit Diagnoses     Nasal congestion    -  Primary   Relevant Orders   POC COVID-19 (Completed)   Cough, unspecified type       Relevant Orders   POC COVID-19 (Completed)     COVID screen negative.  Suspect acute sinusitis.  We explained that sinusitis is frequently viral.  She feels like her current symptoms are different than colds with her frontal and maxillary sinus pressure and headaches. -Stay well-hydrated -Continue Tylenol as needed -We agreed to send in Augmentin 875 mg twice daily for 10 days -Follow-up with primary for any persistent or worsening symptoms.  No follow-ups on file.    Carolann Littler, MD

## 2022-04-02 ENCOUNTER — Other Ambulatory Visit (HOSPITAL_COMMUNITY): Payer: Self-pay

## 2022-04-15 ENCOUNTER — Other Ambulatory Visit: Payer: Self-pay | Admitting: Obstetrics and Gynecology

## 2022-05-08 ENCOUNTER — Encounter: Payer: Self-pay | Admitting: Obstetrics and Gynecology

## 2022-05-08 ENCOUNTER — Ambulatory Visit (INDEPENDENT_AMBULATORY_CARE_PROVIDER_SITE_OTHER): Payer: Medicare Other | Admitting: Obstetrics and Gynecology

## 2022-05-08 VITALS — BP 118/80 | HR 78 | Ht 64.0 in | Wt 144.0 lb

## 2022-05-08 DIAGNOSIS — N952 Postmenopausal atrophic vaginitis: Secondary | ICD-10-CM | POA: Diagnosis not present

## 2022-05-08 DIAGNOSIS — M8580 Other specified disorders of bone density and structure, unspecified site: Secondary | ICD-10-CM | POA: Diagnosis not present

## 2022-05-08 DIAGNOSIS — Z5181 Encounter for therapeutic drug level monitoring: Secondary | ICD-10-CM

## 2022-05-08 MED ORDER — RISEDRONATE SODIUM 150 MG PO TABS: ORAL_TABLET | ORAL | 3 refills | Status: DC

## 2022-05-08 NOTE — Patient Instructions (Signed)
Calcium Content in Foods Calcium is the most abundant mineral in the body. Most of the body's calcium supply is stored in bones and teeth. Calcium helps many parts of the body function normally, including: Blood and blood vessels. Nerves. Hormones. Muscles. Bones and teeth. When your calcium stores are low, you may be at risk for low bone mass, bone loss, and broken bones (fractures). When you get enough calcium, it helps to support strong bones and teeth throughout your life. Calcium is especially important for: Children during growth spurts. Girls during adolescence. Women who are pregnant or breastfeeding. Women after their menstrual cycle stops (postmenopause). Women whose menstrual cycle has stopped due to anorexia nervosa or regular intense exercise. People who cannot eat or digest dairy products. Vegans. Recommended daily amounts of calcium: Women (ages 73 to 44): 1,000 mg per day. Women (ages 44 and older): 1,200 mg per day. Men (ages 35 to 74): 1,000 mg per day. Men (ages 87 and older): 1,200 mg per day. Women (ages 29 to 79): 1,300 mg per day. Men (ages 4 to 11): 1,300 mg per day. General information Eat foods that are high in calcium. Try to get most of your calcium from food. Some people may benefit from taking calcium supplements. Check with your health care provider or diet and nutrition specialist (dietitian) before starting any calcium supplements. Calcium supplements may interact with certain medicines. Too much calcium may cause other health problems, such as constipation and kidney stones. For the body to absorb calcium, it needs vitamin D. Sources of vitamin D include: Skin exposure to direct sunlight. Foods, such as egg yolks, liver, mushrooms, saltwater fish, and fortified milk. Vitamin D supplements. Check with your health care provider or dietitian before starting any vitamin D supplements. What foods are high in calcium?  Foods that are high in calcium contain  more than 100 milligrams per serving. Fruits Fortified orange juice or other fruit juice, 300 mg per 8 oz serving. Vegetables Collard greens, 360 mg per 8 oz serving. Kale, 100 mg per 8 oz serving. Bok choy, 160 mg per 8 oz serving. Grains Fortified ready-to-eat cereals, 100 to 1,000 mg per 8 oz serving. Fortified frozen waffles, 200 mg in 2 waffles. Oatmeal, 140 mg in 1 cup. Meats and other proteins Sardines, canned with bones, 325 mg per 3 oz serving. Salmon, canned with bones, 180 mg per 3 oz serving. Canned shrimp, 125 mg per 3 oz serving. Baked beans, 160 mg per 4 oz serving. Tofu, firm, made with calcium sulfate, 253 mg per 4 oz serving. Dairy Yogurt, plain, low-fat, 310 mg per 6 oz serving. Nonfat milk, 300 mg per 8 oz serving. American cheese, 195 mg per 1 oz serving. Cheddar cheese, 205 mg per 1 oz serving. Cottage cheese 2%, 105 mg per 4 oz serving. Fortified soy, rice, or almond milk, 300 mg per 8 oz serving. Mozzarella, part skim, 210 mg per 1 oz serving. The items listed above may not be a complete list of foods high in calcium. Actual amounts of calcium may be different depending on processing. Contact a dietitian for more information. What foods are lower in calcium? Foods that are lower in calcium contain 50 mg or less per serving. Fruits Apple, about 6 mg. Banana, about 12 mg. Vegetables Lettuce, 19 mg per 2 oz serving. Tomato, about 11 mg. Grains Rice, 4 mg per 6 oz serving. Boiled potatoes, 14 mg per 8 oz serving. White bread, 6 mg per slice. Meats and other proteins  Egg, 27 mg per 2 oz serving. Red meat, 7 mg per 4 oz serving. Chicken, 17 mg per 4 oz serving. Fish, cod, or trout, 20 mg per 4 oz serving. Dairy Cream cheese, regular, 14 mg per 1 Tbsp serving. Brie cheese, 50 mg per 1 oz serving. Parmesan cheese, 70 mg per 1 Tbsp serving. The items listed above may not be a complete list of foods lower in calcium. Actual amounts of calcium may be  different depending on processing. Contact a dietitian for more information. Summary Calcium is an important mineral in the body because it affects many functions. Getting enough calcium helps support strong bones and teeth throughout your life. Try to get most of your calcium from food. Calcium supplements may interact with certain medicines. Check with your health care provider or dietitian before starting any calcium supplements. This information is not intended to replace advice given to you by your health care provider. Make sure you discuss any questions you have with your health care provider. Document Revised: 10/06/2019 Document Reviewed: 10/06/2019 Elsevier Patient Education  [place].

## 2022-05-08 NOTE — Progress Notes (Signed)
71 y.o. G43P2002 [name] here for office visit.  She is followed for osteopenia and vaginal atrophy.   On Actonel since 2019 and using Premarin vaginal cream.  Asking if she should continue on Premarin cream. Not sexually active.   Doing well on Actonel.  Has reflux and taking antacid daily to control her symptoms.  No bone/joint pain.  Normal metabolic profile on 11/17/76.  PCP:  Betty Martinique, MD  Patient's last menstrual period was 06/24/2002 (approximate).           Sexually active: No.  The current method of family planning is tubal ligation.    Exercising: Yes.    Walks dog Smoker:  no  Health Maintenance: Pap:  05/02/2021 neg,  03-29-19 Neg, 03-19-17 Neg, 02-01-15 Neg, 01-25-13 Neg:Neg HR HPV  History of abnormal Pap:  no MMG:   09-29-20 3D/Neg/BiRads1.   Colonoscopy:  within 1 to 2 years per patient, at [facility].  She is due in 3 years.  BMD:   08/27/2021  Result  osteopenia, stable of hip and spine.  TDaP:  07/02/2021 Screening Labs:  PCP   reports that she quit smoking about 34 years ago. Her smoking use included cigarettes. She has never used smokeless tobacco. She reports current alcohol use of about 2.0 standard drinks of alcohol per week. She reports that she does not use drugs.  Past Medical History:  Diagnosis Date   Cancer ([place]) 2018   basal cell--chest and left ear   Oral lichen planus    --hx of oral--pt. states for years   Osteoporosis 03-2012   T-score 1.5/2.8     Past Surgical History:  Procedure Laterality Date   RETINAL TEAR REPAIR CRYOTHERAPY Right    right foot surgery Right 03/23/2018   salivatory gland  1990   Blocked salivatory gland removed   SKIN CANCER EXCISION     TONSILLECTOMY AND ADENOIDECTOMY  Age 42   TUBAL LIGATION  1980    Current Outpatient Medications  Medication Sig Dispense Refill   benazepril (LOTENSIN) 10 MG tablet TAKE 1 TABLET(10 MG) BY MOUTH DAILY 90 tablet 1   cholecalciferol (VITAMIN D) 1000 UNITS tablet Take  1,000 Units by mouth daily.      conjugated estrogens (PREMARIN) vaginal cream INSERT 1/2 GRAM IN THE VAGINA AT BEDTIME 2 TIMES A WEEK 30 g 1   desonide (DESOWEN) 0.05 % cream Apply topically 2 (two) times daily. 30 g 1   DULoxetine (CYMBALTA) 60 MG capsule Take 1 capsule (60 mg total) by mouth daily. 90 capsule 1   risedronate (ACTONEL) 150 MG tablet TAKE 1 TABLET BY MOUTH EVERY MONTH WITH WATER AND ON AN EMPTY STOMACH. DO NOT LIE DOWN FOR NEXT 30 MINUTES AFTER TAKING* 3 tablet 2   No current facility-administered medications for this visit.    Family History  Problem Relation Age of Onset   Hypertension Mother    Atrial fibrillation Mother    Dementia Mother    Hypertension Father    Cancer Father        colon   Neuropathy Sister    Diabetes Maternal Grandmother    Cancer Paternal Grandmother     Review of Systems  All other systems reviewed and are negative.   Exam:   BP 118/80 (BP Location: Right Arm, Patient Position: Sitting, Cuff Size: Normal)   Pulse 78   Ht '5\' 4"'$  (1.626 m)   Wt 144 lb (65.3 kg)   LMP 06/24/2002 (Approximate)   BMI 24.72  kg/m     General appearance: alert, cooperative and appears stated age Head: normocephalic, without obvious abnormality, atraumatic Neck: no adenopathy, supple, symmetrical, trachea midline and thyroid normal to inspection and palpation Lungs: clear to auscultation bilaterally Breasts: normal appearance, no masses or tenderness, No nipple retraction or dimpling, No nipple discharge or bleeding, No axillary adenopathy Heart: regular rate and rhythm Abdomen: soft, non-tender; no masses, no organomegaly Extremities: extremities normal, atraumatic, no cyanosis or edema Skin: skin color, texture, turgor normal. No rashes or lesions Lymph nodes: cervical, supraclavicular, and axillary nodes normal. Neurologic: grossly normal  Pelvic: External genitalia:  no lesions              No abnormal inguinal nodes palpated.               Urethra:  normal appearing urethra with no masses, tenderness or lesions              Bartholins and Skenes: normal                 Vagina: normal appearing vagina with normal color and discharge, no lesions              Cervix: no lesions                Bimanual Exam:  Uterus:  normal size, contour, position, consistency, mobility, non-tender              Adnexa: no mass, fullness, tenderness              Chaperone was present for exam:  Kimalexis  Assessment:   Osteopenia.  Increased fracture risk.  On Actonel.   BMD stable. Atrophy of vagina. FH colon cancer.   Plan: Mammogram screening discussed.  She will schedule.  Self breast awareness reviewed. Will refill vaginal estrogen cream after mammogram back and normal.  I discussed benefits and risks including potential effect on breast cancer.  Refill of Actonel for one year.  Recommendations for calcium and vit D and regular weight bearing exercise. BMD in 2025.  Will get a copy of her last colonoscopy to scan in to Epic.  Follow up annually and prn.   After visit summary provided.   26 min  total time was spent for this patient encounter, including preparation, face-to-face counseling with the patient, coordination of care, and documentation of the encounter.

## 2022-05-09 ENCOUNTER — Other Ambulatory Visit: Payer: Self-pay | Admitting: Obstetrics and Gynecology

## 2022-05-09 DIAGNOSIS — Z1231 Encounter for screening mammogram for malignant neoplasm of breast: Secondary | ICD-10-CM

## 2022-05-28 NOTE — Progress Notes (Signed)
HPI: Hannah Jennings is a 71 y.o. female, who is here today for chronic disease management.  Last seen on 11/28/21 Hypertension: Currently she is on benazepril 10 mg daily. She occasionally checks her blood pressure at home and reports that it remains stable.  Negative for severe/frequent headache, visual changes, chest pain, dyspnea, palpitation, claudication, focal weakness, or worsening edema. She  experiences mild swelling at night, no erythema or pain.  She is on Cymbalta 60 mg daily for leg pain and depression.  Duloxetine was started in 02/2016 because of intermittent , bilateral hip and thigh moderate achy pain, was worse at night  Medication is still helping.  She reports being cautious with her diet, avoiding fried foods. She does not have an exercises routine but engages in light physical activity, such as cleaning the house and mopping the floor.    05/29/2022   11:27 AM 05/29/2022   11:16 AM 03/01/2022   10:01 AM 11/28/2021   10:03 AM 10/31/2021   10:02 AM  Depression screen PHQ 2/9  Decreased Interest 0 0 0 0 0  Down, Depressed, Hopeless 0 0 0 0 0  PHQ - 2 Score 0 0 0 0 0  Altered sleeping   0 0 0  Tired, decreased energy   0 0 0  Change in appetite   0 0 0  Feeling bad or failure about yourself    0 0 0  Trouble concentrating   0 0 0  Moving slowly or fidgety/restless   0 0 0  Suicidal thoughts   0 0 0  PHQ-9 Score   0 0 0  Difficult doing work/chores   Not difficult at all     Review of Systems  Constitutional:  Negative for activity change, appetite change and fever.  HENT:  Negative for mouth sores and nosebleeds.   Respiratory:  Negative for cough and wheezing.   Gastrointestinal:  Negative for abdominal pain, nausea and vomiting.       Negative for changes in bowel habits.  Genitourinary:  Negative for decreased urine volume and hematuria.  Neurological:  Negative for syncope and facial asymmetry.  See other pertinent positives and negatives in  HPI.  Current Outpatient Medications on File Prior to Visit  Medication Sig Dispense Refill   benazepril (LOTENSIN) 10 MG tablet TAKE 1 TABLET(10 MG) BY MOUTH DAILY 90 tablet 1   cholecalciferol (VITAMIN D) 1000 UNITS tablet Take 1,000 Units by mouth daily.      conjugated estrogens (PREMARIN) vaginal cream INSERT 1/2 GRAM IN THE VAGINA AT BEDTIME 2 TIMES A WEEK 30 g 1   desonide (DESOWEN) 0.05 % cream Apply topically 2 (two) times daily. 30 g 1   DULoxetine (CYMBALTA) 60 MG capsule Take 1 capsule (60 mg total) by mouth daily. 90 capsule 1   risedronate (ACTONEL) 150 MG tablet TAKE 1 TABLET BY MOUTH EVERY MONTH WITH WATER AND ON AN EMPTY STOMACH. DO NOT LIE DOWN FOR NEXT 30 MINUTES AFTER TAKING* 3 tablet 3   No current facility-administered medications on file prior to visit.    Past Medical History:  Diagnosis Date   Cancer ([facility]) 2018   basal cell--chest and left ear   Oral lichen planus    --hx of oral--pt. states for years   Osteoporosis 03-2012   T-score 1.5/2.8    No Known Allergies  Social History   Socioeconomic History   Marital status: Married    Spouse name: Not on file   Number of children:  2   Years of education: 16   Highest education level: Bachelor's degree (e.g., BA, AB, BS)  Occupational History   Occupation: retired  Tobacco Use   Smoking status: Former    Types: Cigarettes    Quit date: 01/26/1988    Years since quitting: 34.3   Smokeless tobacco: Never  Vaping Use   Vaping Use: Never used  Substance and Sexual Activity   Alcohol use: Yes    Alcohol/week: 2.0 standard drinks of alcohol    Types: 2 Glasses of wine per week   Drug use: No   Sexual activity: Not Currently    Partners: Male    Birth control/protection: Surgical    Comment: BTL  Other Topics Concern   Not on file  Social History Narrative   HH 3   3 dogs   Retired Scientist, research (physical sciences)   2 grandchildren   Social Determinants of Health   Financial Resource Strain: Low Risk  (10/29/2021)   Overall  Financial Resource Strain (CARDIA)    Difficulty of Paying Living Expenses: Not hard at all  Food Insecurity: No Food Insecurity (10/29/2021)   Hunger Vital Sign    Worried About Running Out of Food in the Last Year: Never true    Ran Out of Food in the Last Year: Never true  Transportation Needs: No Transportation Needs (10/29/2021)   PRAPARE - Administrator, Civil Service (Medical): No    Lack of Transportation (Non-Medical): No  Physical Activity: Inactive (10/29/2021)   Exercise Vital Sign    Days of Exercise per Week: 0 days    Minutes of Exercise per Session: 0 min  Stress: No Stress Concern Present (10/29/2021)   [employer] of Occupational Health - Occupational Stress Questionnaire    Feeling of Stress : Not at all  Social Connections: Moderately Isolated (10/29/2021)   Social Connection and Isolation Panel [NHANES]    Frequency of Communication with Friends and Family: Twice a week    Frequency of Social Gatherings with Friends and Family: Once a week    Attends Religious Services: Never    Database administrator or Organizations: No    Attends Banker Meetings: Not on file    Marital Status: Married   Vitals:   05/29/22 1116  BP: 120/72  Pulse: 95  Resp: 16  SpO2: 98%   Body mass index is 24.44 kg/m.  Physical Exam Vitals and nursing note reviewed.  Constitutional:      General: She is not in acute distress.    Appearance: She is well-developed.  HENT:     Head: Normocephalic and atraumatic.     Mouth/Throat:     Mouth: Mucous membranes are moist.     Pharynx: Oropharynx is clear.  Eyes:     Conjunctiva/sclera: Conjunctivae normal.  Cardiovascular:     Rate and Rhythm: Normal rate and regular rhythm.     Pulses:          Dorsalis pedis pulses are 2+ on the right side and 2+ on the left side.     Heart sounds: No murmur heard. Pulmonary:     Effort: Pulmonary effort is normal. No respiratory distress.     Breath sounds: Normal  breath sounds.  Abdominal:     Palpations: Abdomen is soft. There is no hepatomegaly or mass.     Tenderness: There is no abdominal tenderness.  Lymphadenopathy:     Cervical: No cervical adenopathy.  Skin:    General:  Skin is warm.     Findings: No erythema or rash.  Neurological:     General: No focal deficit present.     Mental Status: She is alert and oriented to person, place, and time.     Cranial Nerves: No cranial nerve deficit.     Gait: Gait normal.  Psychiatric:     Comments: Well groomed, good eye contact.   ASSESSMENT AND PLAN:  Ms.Zamaria was seen today for follow-up.  Diagnoses and all orders for this visit: Lab Results  Component Value Date   CREATININE 0.78 05/29/2022   BUN 13 05/29/2022   NA 139 05/29/2022   K 4.3 05/29/2022   CL 103 05/29/2022   CO2 30 05/29/2022   Lab Results  Component Value Date   ALT 39 (H) 05/29/2022   AST 33 05/29/2022   ALKPHOS 75 05/29/2022   BILITOT 0.3 05/29/2022   Lab Results  Component Value Date   CHOL 209 (H) 05/29/2022   HDL 62.10 05/29/2022   LDLCALC 125 (H) 05/29/2022   TRIG 107.0 05/29/2022   CHOLHDL 3 05/29/2022   The 10-year ASCVD risk score (Arnett DK, et al., 2019) is: 13.7%   Values used to calculate the score:     Age: 8 years     Sex: Female     Is Non-Hispanic African American: No     Diabetic: No     Tobacco smoker: No     Systolic Blood Pressure: 120 mmHg     Is BP treated: Yes     HDL Cholesterol: 62.1 mg/dL     Total Cholesterol: 209 mg/dL  Essential (primary) hypertension BP adequately controlled today. Continue Benazepril 10 mg daily. Continue monitoring BP at home. Low salt/DASH diet to continue. F/U in 5-6 months.  Arthralgia of both lower legs Problem is well controlled since she has been on Cymbalta 60 mg. She tried to stop medication once and pain reoccured. No changes in current management.  Major depression in partial remission (HCC) Problem is stable. Continue Cymbalta 60  mg daily.  Hyperlipidemia Non pharmacologic treatment recommended for now. Further recommendations will be given according to 10 years CVD risk score and lipid panel numbers.  Return in about 6 months (around 11/28/2022) for CPE.  Klinton Candelas G. [place], MD  [facility]. [place] office.

## 2022-05-29 ENCOUNTER — Ambulatory Visit (INDEPENDENT_AMBULATORY_CARE_PROVIDER_SITE_OTHER): Payer: Medicare Other | Admitting: Family Medicine

## 2022-05-29 ENCOUNTER — Encounter: Payer: Self-pay | Admitting: Family Medicine

## 2022-05-29 VITALS — BP 120/72 | HR 95 | Resp 16 | Ht 64.0 in | Wt 142.4 lb

## 2022-05-29 DIAGNOSIS — F3341 Major depressive disorder, recurrent, in partial remission: Secondary | ICD-10-CM | POA: Diagnosis not present

## 2022-05-29 DIAGNOSIS — I10 Essential (primary) hypertension: Secondary | ICD-10-CM | POA: Diagnosis not present

## 2022-05-29 DIAGNOSIS — M25561 Pain in right knee: Secondary | ICD-10-CM

## 2022-05-29 DIAGNOSIS — E782 Mixed hyperlipidemia: Secondary | ICD-10-CM | POA: Diagnosis not present

## 2022-05-29 DIAGNOSIS — M25562 Pain in left knee: Secondary | ICD-10-CM

## 2022-05-29 DIAGNOSIS — F330 Major depressive disorder, recurrent, mild: Secondary | ICD-10-CM

## 2022-05-29 LAB — COMPREHENSIVE METABOLIC PANEL
ALT: 39 U/L — ABNORMAL HIGH (ref 0–35)
AST: 33 U/L (ref 0–37)
Albumin: 4.5 g/dL (ref 3.5–5.2)
Alkaline Phosphatase: 75 U/L (ref 39–117)
BUN: 13 mg/dL (ref 6–23)
CO2: 30 mEq/L (ref 19–32)
Calcium: 8.9 mg/dL (ref 8.4–10.5)
Chloride: 103 mEq/L (ref 96–112)
Creatinine, Ser: 0.78 mg/dL (ref 0.40–1.20)
GFR: 75.92 mL/min (ref >60.00)
Glucose, Bld: 99 mg/dL (ref 70–99)
Potassium: 4.3 mEq/L (ref 3.5–5.1)
Sodium: 139 mEq/L (ref 135–145)
Total Bilirubin: 0.3 mg/dL (ref 0.2–1.2)
Total Protein: 7.5 g/dL (ref 6.0–8.3)

## 2022-05-29 LAB — LIPID PANEL
Cholesterol: 209 mg/dL — ABNORMAL HIGH (ref 0–200)
HDL: 62.1 mg/dL (ref >39.00)
LDL Cholesterol: 125 mg/dL — ABNORMAL HIGH (ref 0–99)
NonHDL: 146.76
Total CHOL/HDL Ratio: 3
Triglycerides: 107 mg/dL (ref 0.0–149.0)
VLDL: 21.4 mg/dL (ref 0.0–40.0)

## 2022-05-29 NOTE — Assessment & Plan Note (Signed)
Problem is well controlled since she has been on Cymbalta 60 mg. She tried to stop medication once and pain reoccured. No changes in current management.

## 2022-05-29 NOTE — Assessment & Plan Note (Signed)
Problem is stable. Continue Cymbalta 60 mg daily.

## 2022-05-29 NOTE — Patient Instructions (Signed)
A few things to remember from today's visit:  Essential (primary) hypertension - Plan: Comprehensive metabolic panel  Mixed hyperlipidemia - Plan: Comprehensive metabolic panel, Lipid panel  Depression, major, recurrent, mild (HCC)  No changes today. I will see you abck in 6 months. Keep appt in 06/2022, Medicare visit.  If you need refills for medications you take chronically, please call your pharmacy. Do not use My Chart to request refills or for acute issues that need immediate attention. If you send a my chart message, it may take a few days to be addressed, specially if I am not in the office.  Please be sure medication list is accurate. If a new problem present, please set up appointment sooner than planned today.

## 2022-05-29 NOTE — Assessment & Plan Note (Signed)
BP adequately controlled today. Continue Benazepril 10 mg daily. Continue monitoring BP at home. Low salt/DASH diet to continue. F/U in 5-6 months.

## 2022-05-30 NOTE — Assessment & Plan Note (Signed)
Non pharmacologic treatment recommended for now. Further recommendations will be given according to 10 years CVD risk score and lipid panel numbers. 

## 2022-06-30 ENCOUNTER — Emergency Department (HOSPITAL_COMMUNITY): Payer: Medicare Other

## 2022-06-30 ENCOUNTER — Other Ambulatory Visit: Payer: Self-pay

## 2022-06-30 ENCOUNTER — Emergency Department (HOSPITAL_COMMUNITY)
Admission: EM | Admit: 2022-06-30 | Discharge: 2022-06-30 | Disposition: A | Discharge status: Home / Self Care | Payer: Medicare Other | Attending: Emergency Medicine | Admitting: Emergency Medicine

## 2022-06-30 ENCOUNTER — Encounter (HOSPITAL_COMMUNITY): Payer: Self-pay | Admitting: Pharmacy Technician

## 2022-06-30 DIAGNOSIS — W010XXA Fall on same level from slipping, tripping and stumbling without subsequent striking against object, initial encounter: Secondary | ICD-10-CM | POA: Insufficient documentation

## 2022-06-30 DIAGNOSIS — I10 Essential (primary) hypertension: Secondary | ICD-10-CM | POA: Insufficient documentation

## 2022-06-30 DIAGNOSIS — S42032A Displaced fracture of lateral end of left clavicle, initial encounter for closed fracture: Secondary | ICD-10-CM

## 2022-06-30 DIAGNOSIS — Y9301 Activity, walking, marching and hiking: Secondary | ICD-10-CM | POA: Diagnosis not present

## 2022-06-30 DIAGNOSIS — S4992XA Unspecified injury of left shoulder and upper arm, initial encounter: Secondary | ICD-10-CM | POA: Diagnosis present

## 2022-06-30 DIAGNOSIS — M25512 Pain in left shoulder: Secondary | ICD-10-CM

## 2022-06-30 NOTE — ED Triage Notes (Signed)
Pt here via POV after tripping over her dog while walking landing on L shoulder. Pt with pain and decreased ROM. Denies hitting head or LOC. CNS intact.

## 2022-06-30 NOTE — ED Provider Notes (Signed)
[facility] EMERGENCY DEPARTMENT Provider Note   CSN: [id number] Arrival date & time: 06/30/22  2778     History  No chief complaint on file.   Hannah Jennings is a 72 y.o. female history includes GERD, hypertension, osteoporosis.  Patient presents to the ER for evaluation of left shoulder pain after fall that occurred this morning she tripped over her dog landed onto her left shoulder she had immediate pain is worse with motion improves with rest.  Pain is mild at rest and does not radiate.  She reports pain is sharp and becomes moderate with movement.  She denies head injury, loss consciousness, blood thinner use, headache, neck pain, back pain, chest pain, abdominal pain, numbness, tingling, weakness or any additional injuries or concerns.  HPI     Home Medications Prior to Admission medications   Medication Sig Start Date End Date Taking? Authorizing Provider  benazepril (LOTENSIN) 10 MG tablet TAKE 1 TABLET(10 MG) BY MOUTH DAILY 11/29/21   Martin[place], Betty G, MD  cholecalciferol (VITAMIN D) 1000 UNITS tablet Take 1,000 Units by mouth daily.     [provider]  conjugated estrogens (PREMARIN) vaginal cream INSERT 1/2 GRAM IN THE VAGINA AT BEDTIME 2 TIMES A WEEK 05/02/21   Yisroel Ramming, Brook E, MD  desonide (DESOWEN) 0.05 % cream Apply topically 2 (two) times daily. 08/28/20   Martin[place], Betty G, MD  DULoxetine (CYMBALTA) 60 MG capsule Take 1 capsule (60 mg total) by mouth daily. 01/16/22   Martin[place], Betty G, MD  risedronate (ACTONEL) 150 MG tablet TAKE 1 TABLET BY MOUTH EVERY MONTH WITH WATER AND ON AN EMPTY STOMACH. DO NOT LIE DOWN FOR NEXT 30 MINUTES AFTER TAKING* 05/08/22   Nunzio Cobbs, MD      Allergies    Patient has no known allergies.    Review of Systems   Review of Systems  Cardiovascular: Negative.  Negative for chest pain.  Gastrointestinal: Negative.  Negative for abdominal pain.  Musculoskeletal:  Positive for arthralgias.  Negative for back pain and neck pain.  Skin: Negative.  Negative for color change and wound.  Neurological: Negative.  Negative for syncope and headaches.    Physical Exam Updated Vital Signs BP (!) 147/70   Pulse 94   Temp 98 F (36.7 C) (Oral)   Resp 18   LMP 06/24/2002 (Approximate)   SpO2 100%  Physical Exam Constitutional:      General: She is not in acute distress.    Appearance: Normal appearance. She is well-developed. She is not ill-appearing or diaphoretic.  HENT:     Head: Normocephalic and atraumatic. No abrasion or contusion.     Jaw: There is normal jaw occlusion.     Right Ear: No hemotympanum.     Left Ear: No hemotympanum.     Nose: Nose normal.     Mouth/Throat:     Mouth: Mucous membranes are moist.     Pharynx: Oropharynx is clear.  Eyes:     General: Vision grossly intact. Gaze aligned appropriately.     Pupils: Pupils are equal, round, and reactive to light.  Neck:     Trachea: Trachea and phonation normal.  Cardiovascular:     Rate and Rhythm: Normal rate and regular rhythm.     Pulses:          Radial pulses are 2+ on the right side and 2+ on the left side.  Pulmonary:     Effort: Pulmonary  effort is normal. No accessory muscle usage or respiratory distress.     Breath sounds: Normal air entry.  Chest:     Chest wall: No tenderness.  Abdominal:     General: There is no distension.     Palpations: Abdomen is soft.     Tenderness: There is no abdominal tenderness. There is no guarding or rebound.  Musculoskeletal:        General: Normal range of motion.     Cervical back: Normal range of motion. No spinous process tenderness or muscular tenderness.     Comments: Left shoulder: Atraumatic in appearance, no overlying skin changes or deformity.  No skin breaks.  Patient is diffusely tender to the left deltoid.  She has no tenderness along the clavicle.  No tenderness of the acromion or scapular spine.  No tenderness to the cervical spine.  No  tenderness to the para spinal or parascapular muscles.  No tenderness to the biceps or triceps.  No tenderness to the elbow or lower arm.  She has limited range of motion of the left shoulder particularly with forward flexion only intact to around 30-40 degrees due to pain.  Passive range of motion intact to 90 degrees.  Abduction is intact to around 80-90 degrees with mild pain.  Mild pain additionally with extension intact to around -20 degrees.  External rotation is limited due to pain with weakness.  No pain with internal rotation.  No pain with motion of the cervical spine or elbow.  Radial pulse intact.  Capillary fill and sensation intact.  Grip intact.  Compartments soft.  No midline spinal tenderness palpation.  No crepitus step-off deformity spine.  No pain with motion of the cervical spine.  Patient demonstrates range of motion with all other major joints without pain or difficulty.  Skin:    General: Skin is warm and dry.  Neurological:     Mental Status: She is alert.     GCS: GCS eye subscore is 4. GCS verbal subscore is 5. GCS motor subscore is 6.     Comments: Speech is clear and goal oriented, follows commands Major Cranial nerves without deficit, no facial droop Moves extremities without ataxia, coordination intact  Psychiatric:        Behavior: Behavior normal.     ED Results / Procedures / Treatments   Labs (all labs ordered are listed, but only abnormal results are displayed) Labs Reviewed - No data to display  EKG None  Radiology DG Shoulder Left  Result Date: 06/30/2022 CLINICAL DATA:  Shoulder pain after falling. EXAM: LEFT SHOULDER - 2+ VIEW COMPARISON:  None Available. FINDINGS: The proximal humerus appears intact and normally located. There is suspicion of a mildly displaced acute fracture of the distal clavicle associated with mild superior displacement of the clavicle relative to the acromion. The subacromial space is preserved. No soft tissue abnormalities are  identified. IMPRESSION: Suspected mildly displaced acute fracture of the distal clavicle with mild superior displacement of the clavicle relative to the acromion. Electronically Signed   By: Richardean Sale M.D.   On: 06/30/2022 11:11    Procedures Procedures    Medications Ordered in ED Medications - No data to display  ED Course/ Medical Decision Making/ A&P Clinical Course as of 06/30/22 1208  Nancy Fetter Jun 30, 2022  1123 DG Shoulder Left I have personally reviewed and interpreted three-view radiograph of the patient's left shoulder: On my interpretation I do agree with radiologist there is appears to be a  small cortical irregularity at the distal tip of the clavicle.  I do not appreciate any other obvious acute displaced fractures or dislocations. [BM]    Clinical Course User Index [BM] Gari Crown                           Medical Decision Making Left shoulder pain:  Closed suspected acute minimally displaced left distal clavicle fracture and possible AC joint sprain  Plan is sling for comfort, RICE therapy.  Gentle range of motion exercises to help avoid adhesive capsulitis.  Nonweightbearing to left arm until cleared by orthopedist.  Patient given referral to on-call orthopedist Dr. Aurea Graff office for further management.  Patient is neurovascularly intact at time of evaluation.  No red flags to suggest open fracture, septic joint, osteomyelitis, compartment syndrome, cellulitis or other emergent pathologies at this point.  Amount and/or Complexity of Data Reviewed Radiology: ordered and independent interpretation performed. Decision-making details documented in ED Course.  Risk Risk Details: Patient denies any additional symptoms today.  No head injury/loss conscious, headache, neck pain, back pain, chest pain, abdominal pain, numbness, tingling, weakness or any additional concerns.  ER precautions discussed with patient.    At this time there does not appear to be any  evidence of an acute emergency medical condition and the patient appears stable for discharge with appropriate outpatient follow up. Diagnosis was discussed with patient who verbalizes understanding of care plan and is agreeable to discharge. I have discussed return precautions with patient who verbalizes understanding. Patient encouraged to follow-up with their PCP and Orthopedist. All questions answered.  Patient's case discussed with Dr. Tomi Bamberger who agrees with plan to discharge with follow-up.   Note: Portions of this report may have been transcribed using voice recognition software. Every effort was made to ensure accuracy; however, inadvertent computerized transcription errors may still be present.         Final Clinical Impression(s) / ED Diagnoses Final diagnoses:  Acute pain of left shoulder  Closed displaced fracture of acromial end of left clavicle, initial encounter    Rx / DC Orders ED Discharge Orders     None         Gari Crown 06/30/22 1208    Dorie Rank, MD 06/30/22 2024

## 2022-06-30 NOTE — ED Notes (Signed)
DC instructions reviewed with pt. PT verbalized understanding. PT DC °

## 2022-06-30 NOTE — ED Provider Triage Note (Signed)
Emergency Medicine Provider Triage Evaluation Note  Hannah Jennings , a 72 y.o. female  was evaluated in triage.  Pt complains of left shoulder pain.  Patient tripped over her dog today fell onto her left shoulder.  She had immediate pain worsens with movement improves with rest..  Review of Systems  Positive: Left shoulder pain Negative: Head injury, loss conscious, blood thinner use, headache, neck pain, back pain, chest pain, abdominal pain or any additional injuries or concerns.  Physical Exam  BP (!) 147/70   Pulse 94   Temp 98 F (36.7 C) (Oral)   Resp 18   LMP 06/24/2002 (Approximate)   SpO2 100%  Gen:   Awake, no distress   Resp:  Normal effort  Head:            Normocephalic atraumatic.  No hemotympanum. Neck:           Atraumatic in appearance.  No midline spinal tenderness.  No crepitus step-off or deformity the spine.  No pain with motion. MSK:   Patient guarding left shoulder on exam.  She is no tenderness to the clavicle, acromion, scapular spine.  No tenderness of the cervical spine.  She is mildly tender to the deltoid.  No tenderness to the paracervical spine or scapular muscles.  No tenderness to the biceps triceps or elbow.  AROM with flexion limited to around 30 degrees.  Abduction intact to 80 degrees.  Increased pain with end external rotation, no pain with internal rotation.  No pain with motion of the elbow.  Radial pulse intact.  Capillary fill and sensation tact.  Compartments soft.  Grip intact.   Medical Decision Making  Medically screening exam initiated at 10:18 AM.  Appropriate orders placed.  Hannah Jennings was informed that the remainder of the evaluation will be completed by another provider, this initial triage assessment does not replace that evaluation, and the importance of remaining in the ED until their evaluation is complete.    Note: Portions of this report may have been transcribed using voice recognition software. Every effort was made  to ensure accuracy; however, inadvertent computerized transcription errors may still be present.    Deliah Boston, PA-C 06/30/22 1020

## 2022-06-30 NOTE — Discharge Instructions (Addendum)
At this time there does not appear to be the presence of an emergent medical condition, however there is always the potential for conditions to change. Please read and follow the below instructions.  Please return to the Emergency Department immediately for any new or worsening symptoms. Please be sure to follow up with your Primary Care Provider within one week regarding your visit today; please call their office to schedule an appointment even if you are feeling better for a follow-up visit. You may call the on-call on-call orthopedist Dr. Aurea Graff office for further treatment of your left shoulder injury.  Please follow-up with them sometime in the next week for further management.  Please use rest ice and elevation of your symptoms.  You may use a sling for comfort.  You may use gentle range of motion exercises to help avoid stiffness.  Do not lift anything or perform any activities that cause increased pain. Your blood pressure was slightly elevated today.  Please have it rechecked by your primary care provider within the next week to ensure improvement.   Please read the additional information packets attached to your discharge summary.  Go to the nearest Emergency Department immediately if: You have fever or chills Your arm is numb. This means that you cannot feel it. Your arm is cold. Your arm is pale. You have any new/concerning or worsening of symptoms.  Do not take your medicine if  develop an itchy rash, swelling in your mouth or lips, or difficulty breathing; call 911 and seek immediate emergency medical attention if this occurs.  You may review your lab tests and imaging results in their entirety on your MyChart account.  Please discuss all results of fully with your primary care provider and other specialist at your follow-up visit.  Note: Portions of this text may have been transcribed using voice recognition software. Every effort was made to ensure accuracy; however, inadvertent  computerized transcription errors may still be present.

## 2022-07-02 ENCOUNTER — Ambulatory Visit: Payer: Medicare Other

## 2022-07-08 ENCOUNTER — Encounter: Payer: Self-pay | Admitting: Obstetrics and Gynecology

## 2022-07-08 NOTE — Telephone Encounter (Signed)
Scheduled for mammo 08/21/22.

## 2022-07-23 ENCOUNTER — Ambulatory Visit: Payer: Medicare Other

## 2022-07-24 ENCOUNTER — Other Ambulatory Visit: Payer: Self-pay | Admitting: Family Medicine

## 2022-07-24 DIAGNOSIS — I10 Essential (primary) hypertension: Secondary | ICD-10-CM

## 2022-07-30 ENCOUNTER — Telehealth (INDEPENDENT_AMBULATORY_CARE_PROVIDER_SITE_OTHER): Payer: Medicare Other | Admitting: Family Medicine

## 2022-07-30 VITALS — BP 146/90 | Wt 142.0 lb

## 2022-07-30 DIAGNOSIS — Z0000 Encounter for general adult medical examination without abnormal findings: Secondary | ICD-10-CM

## 2022-07-30 DIAGNOSIS — I10 Essential (primary) hypertension: Secondary | ICD-10-CM

## 2022-07-30 NOTE — Patient Instructions (Addendum)
I really enjoyed getting to talk with you today! I am available on Tuesdays and Thursdays for virtual visits if you have any questions or concerns, or if I can be of any further assistance.   CHECKLIST FROM ANNUAL WELLNESS VISIT:  -Follow up (please call to schedule if not scheduled after visit):   -Inperson visit with your Primary Doctor office: in 1-2 weeks if blood pressure remains elevated   -yearly for annual wellness visit with primary care office  Here is a list of your preventive care/health maintenance measures and the plan for each if any are due:  Health Maintenance  Topic Date Due   COVID-19 Vaccine (6 - 2023-24 season) 04/25/2023 (Originally 06/13/2022)   MAMMOGRAM  09/30/2022   Medicare Annual Wellness (AWV)  07/31/2023   COLONOSCOPY (Pts 45-43yr Insurance coverage will need to be confirmed)  10/20/2031   DTaP/Tdap/Td (4 - Td or Tdap) 05/20/2032   Pneumonia Vaccine 72 Years old  Completed   INFLUENZA VACCINE  Completed   DEXA SCAN  Completed   Hepatitis C Screening  Completed   Zoster Vaccines- Shingrix  Completed   HPV VACCINES  Aged Out    -See a dentist at least yearly  -Get your eyes checked and then per your eye specialist's recommendations  -Other issues addressed today: -Elevated Blood Pressure: monitor once daily at home this week (sit for 5 minutes feet flat on the floor, arm on counter). If remains elevated schedule inperson visit. Consider low sodium diet - see information provided below.   -I have included below further information regarding a healthy whole foods based diet, physical activity guidelines for adults, stress management and opportunities for social connections. I hope you find this information useful.      NUTRITION: -eat real food: lots of colorful vegetables (half the plate) and fruits -5-7 servings of vegetables and fruits per day (fresh or steamed is best), exp. 2 servings of vegetables with lunch and dinner and 2 servings of fruit per day. Berries and greens such as kale and collards are great choices.  -consume on a regular basis: whole grains (make sure first ingredient on label contains the word "whole"), fresh fruits, fish, nuts, seeds, healthy oils (such as olive oil, avocado oil, grape seed oil) -may eat small amounts of dairy and lean meat on occasion, but avoid processed meats such as ham, bacon, lunch meat, etc. -drink water -try to avoid fast food and pre-packaged foods, processed meat -most experts advise limiting sodium to < '2300mg'$  per day, should limit further is any chronic conditions such as high blood pressure, heart disease, diabetes, etc. The American Heart Association advised that < '1500mg'$  is is ideal -try to avoid foods that contain any ingredients with names you do not recognize  -try to avoid sugar/sweets (except for the natural sugar that occurs in fresh fruit) -try to avoid sweet drinks -try to avoid white rice, white bread, pasta (unless whole grain), white or yellow potatoes  EXERCISE GUIDELINES FOR ADULTS: -if you wish to increase your physical activity, do so gradually and with the approval of your doctor -STOP and seek medical care immediately if you have any chest pain, chest discomfort or trouble breathing when starting or increasing exercise  -move and stretch your body, legs, feet and arms when sitting for long periods -Physical activity guidelines for optimal health in adults: -least 150 minutes per week of  aerobic exercise (can talk, but not sing) once approved by your doctor, 20-30 minutes of sustained activity or  two 10 minute episodes of sustained activity every day.  -resistance training at least 2 days per week if approved by your doctor -balance exercises 3+ days per week:   Stand somewhere where you have something sturdy to hold onto if you lose balance.    1) lift up on toes, start with 5x per day and work up to 20x   2) stand and lift on leg straight out to the side so that foot is a few inches of the floor, start with 5x each side and work up to 20x each side   3) stand on one foot, start with 5 seconds each side and work up to 20 seconds on each side  If you need ideas or help with getting more active:  -Silver sneakers https://tools.silversneakers.com  -Walk with a Doc: http://stephens-thompson.biz/  -try to include resistance (weight lifting/strength building) and balance exercises twice per week: or the following link for ideas: ChessContest.fr  UpdateClothing.com.cy  STRESS MANAGEMENT: -can try meditating, or just sitting quietly with deep breathing while intentionally relaxing all parts of your body for 5 minutes daily -if you need further help with stress, anxiety or depression please follow up with your primary doctor or contact the wonderful folks at [place]: [place]: -options in [place] if you wish to engage in more social and exercise related activities:  -Silver sneakers https://tools.silversneakers.com  -Walk with a Doc: http://stephens-thompson.biz/  -Check out the [place] 50+ section on the [place] [employer] (hiking clubs, book clubs, cards and games, chess, exercise classes, aquatic classes and much more) - see the website for  details: https://www.[place].gov/departments/parks-recreation/active-adults50  -YouTube has lots of exercise videos for different ages and abilities as well  -[place] (a variety of indoor and outdoor inperson activities for adults). [phone]. [address]  -Virtual Online Classes (a variety of topics): see seniorplanet.org or call [phone]  -consider volunteering at a school, hospice center, church, senior center or elsewhere

## 2022-07-30 NOTE — Progress Notes (Signed)
PATIENT CHECK-IN and HEALTH RISK ASSESSMENT QUESTIONNAIRE:  -completed by phone/video for upcoming Medicare Preventive Visit  Pre-Visit Check-in: 1)Vitals (height, wt, BP, etc) - record in vitals section for visit on day of visit 2)Review and Update Medications, Allergies PMH, Surgeries, Social history in Epic 3)Hospitalizations in the last year with date/reason?-None- 4)Review and Update Care Team (patient's specialists) in Epic 5) Complete PHQ9 in Epic  6) Complete Fall Screening in Epic 7)Review all Health Maintenance Due and order under PCP if not done.  8)Medicare Wellness Questionnaire: Answer theses question about your habits: Do you drink alcohol? Wine  If yes, how many drinks do you have a day? small glass every other day Have you ever smoked?Yes  Quit date if applicable? quit years ago How many packs a day do/did you smoke? stopped years ago Do you use smokeless tobacco?No Do you use an illicit drugs?No Do you exercises? She walks the dogs and gets out in the yard a few hours per week.  Are you sexually active? No  Number of partners?N/A Typical breakfast: Bowl of cereal Typical lunch: Sandwich Typical dinner: meat, 2 with vegetables Typical snacks:chips, apple   Beverages: water, coffee in the morning, sometime tea  Answer theses question about you: Can you perform most household chores?-Yes Do you find it hard to follow a conversation in a noisy room? Sometimes Do you often ask people to speak up or repeat themselves?No Do you feel that you have a problem with memory?-No Do you balance your checkbook and or bank acounts?-No husband takes care of accounts Do you feel safe at home?-Yes Last dentist visit?few months ago Do you need assistance with any of the following: Please note if so No-  Driving?  Feeding yourself?  Getting from bed to chair?  Getting to the toilet?  Bathing or showering?  Dressing yourself?  Managing money?  Climbing a flight of  stairs  Preparing meals?  Do you have Advanced Directives in place (Living Will, Healthcare Power or Attorney)? Yes   Last eye Exam and location?-few months ago in [place]   Do you currently use prescribed or non-prescribed narcotic or opioid pain medications?-No   Do you have a history or close family history of breast, ovarian, tubal or peritoneal cancer or a family member with BRCA (breast cancer susceptibility 1 and 2) gene mutations?  Nurse/Assistant Credentials/time stamp:   ----------------------------------------------------------------------------------------------------------------------------------------------------------------------------------------------------------------------   MEDICARE ANNUAL PREVENTIVE VISIT WITH PROVIDER: (Welcome to Medicare, initial annual wellness or annual wellness exam)  Virtual Visit via Video Note  I connected with Hannah Jennings  on 07/30/22 by video enabled telemedicine application and verified that I am speaking with the correct person using two identifiers.  Location patient: home Location provider:work or home office Persons participating in the virtual visit: patient, provider  Concerns and/or follow up today: BP on check for visit today a little up, but reports usually great. Denies CP, SOB, Headache, vision changes, swelling. Just to BP med.  Reports has been fine. Reports came off of her cymbalta two weeks and feels fine. Feels fine off of it - was for leg pain.  Had fall - her big dogs pulled her over. She sees ortho tomorrow for crack in clavicle, feels has healed fine.   See HM section in Epic for other details of completed HM.    ROS: negative for report of fevers, unintentional weight loss, vision changes, vision loss, hearing loss or change, chest pain, sob, hemoptysis, melena, hematochezia, hematuria, genital discharge or lesions, bleeding or bruising, loc,  thoughts of suicide or self harm, memory loss  Patient-completed  extensive health risk assessment - reviewed and discussed with the patient: See Health Risk Assessment completed with patient prior to the visit either above or in recent phone note. This was reviewed in detailed with the patient today and appropriate recommendations, orders and referrals were placed as needed per Summary below and patient instructions.   Review of Medical History: -PMH, PSH, Family History and current specialty and care providers reviewed and updated and listed below   Patient Care Team: Martinique, Betty G, MD as PCP - General (Family Medicine) Nunzio Cobbs, MD as Consulting Physician (Obstetrics and Gynecology)   Past Medical History:  Diagnosis Date   Cancer [facility]) 2018   basal cell--chest and left ear   Oral lichen planus    --hx of oral--pt. states for years   Osteoporosis 03-2012   T-score 1.5/2.8     Past Surgical History:  Procedure Laterality Date   RETINAL TEAR REPAIR CRYOTHERAPY Right    right foot surgery Right 03/23/2018   salivatory gland  1990   Blocked salivatory gland removed   SKIN CANCER EXCISION     TONSILLECTOMY AND ADENOIDECTOMY  Age 47   TUBAL LIGATION  1980    Social History   Socioeconomic History   Marital status: Married    Spouse name: Not on file   Number of children: 2   Years of education: 16   Highest education level: Bachelor's degree (e.g., BA, AB, BS)  Occupational History   Occupation: retired  Tobacco Use   Smoking status: Former    Types: Cigarettes    Quit date: 01/26/1988    Years since quitting: 34.5   Smokeless tobacco: Never  Vaping Use   Vaping Use: Never used  Substance and Sexual Activity   Alcohol use: Yes    Alcohol/week: 2.0 standard drinks of alcohol    Types: 2 Glasses of wine per week   Drug use: No   Sexual activity: Not Currently    Partners: Male    Birth control/protection: Surgical    Comment: BTL  Other Topics Concern   Not on file  Social History Narrative   [place] 3   3 dogs    Retired Copywriter, advertising   2 grandchildren   Social Determinants of Health   Financial Resource Strain: Low Risk  (10/29/2021)   Overall Financial Resource Strain (CARDIA)    Difficulty of Paying Living Expenses: Not hard at all  Food Insecurity: No Food Insecurity (10/29/2021)   Hunger Vital Sign    Worried About Running Out of Food in the Last Year: Never true    [place] in the Last Year: Never true  Transportation Needs: No Transportation Needs (10/29/2021)   PRAPARE - Hydrologist (Medical): No    Lack of Transportation (Non-Medical): No  Physical Activity: Unknown (10/29/2021)   Exercise Vital Sign    Days of Exercise per Week: 0 days    Minutes of Exercise per Session: Not on file  Recent Concern: Physical Activity - Inactive (10/29/2021)   Exercise Vital Sign    Days of Exercise per Week: 0 days    Minutes of Exercise per Session: 0 min  Stress: No Stress Concern Present (10/29/2021)   Cecilia    Feeling of Stress : Not at all  Social Connections: Moderately Isolated (10/29/2021)   Social Connection and Isolation Panel [  NHANES]    Frequency of Communication with Friends and Family: Twice a week    Frequency of Social Gatherings with Friends and Family: Once a week    Attends Religious Services: Never    Marine scientist or Organizations: No    Attends Music therapist: Not on file    Marital Status: Married  Human resources officer Violence: Not At Risk (08/14/2020)   Humiliation, Afraid, Rape, and Kick questionnaire    Fear of Current or Ex-Partner: No    Emotionally Abused: No    Physically Abused: No    Sexually Abused: No    Family History  Problem Relation Age of Onset   Hypertension Mother    Atrial fibrillation Mother    Dementia Mother    Hypertension Father    Cancer Father        colon   Neuropathy Sister    Diabetes Maternal Grandmother    Cancer Paternal  Grandmother     Current Outpatient Medications on File Prior to Visit  Medication Sig Dispense Refill   benazepril (LOTENSIN) 10 MG tablet TAKE 1 TABLET(10 MG) BY MOUTH DAILY 90 tablet 1   cholecalciferol (VITAMIN D) 1000 UNITS tablet Take 1,000 Units by mouth daily.      conjugated estrogens (PREMARIN) vaginal cream INSERT 1/2 GRAM IN THE VAGINA AT BEDTIME 2 TIMES A WEEK 30 g 1   desonide (DESOWEN) 0.05 % cream Apply topically 2 (two) times daily. 30 g 1   DULoxetine (CYMBALTA) 60 MG capsule Take 1 capsule (60 mg total) by mouth daily. 90 capsule 1   risedronate (ACTONEL) 150 MG tablet TAKE 1 TABLET BY MOUTH EVERY MONTH WITH WATER AND ON AN EMPTY STOMACH. DO NOT LIE DOWN FOR NEXT 30 MINUTES AFTER TAKING* 3 tablet 3   No current facility-administered medications on file prior to visit.    No Known Allergies     Physical Exam Vitals:   07/30/22 1219  BP: (!) 146/90   Estimated body mass index is 24.37 kg/m as calculated from the following:   Height as of 05/29/22: '5\' 4"'$  (1.626 m).   Weight as of this encounter: 142 lb (64.4 kg).  EKG (optional): deferred due to virtual visit  GENERAL: alert, oriented, no acute distress detected, full vision exam deferred due to pandemic and/or virtual encounter   HEENT: atraumatic, conjunttiva clear, no obvious abnormalities on inspection of external nose and ears  NECK: normal movements of the head and neck  LUNGS: on inspection no signs of respiratory distress, breathing rate appears normal, no obvious gross SOB, gasping or wheezing  CV: no obvious cyanosis  MS: moves all visible extremities without noticeable abnormality  PSYCH/NEURO: pleasant and cooperative, no obvious depression or anxiety, speech and thought processing grossly intact, Cognitive function grossly intact  Flowsheet Row Video Visit from 07/30/2022 in [place] at [facility]  PHQ-9 Total Score 1           07/30/2022   12:00 PM 07/30/2022   11:59  AM 05/30/2022   10:01 PM 05/29/2022   11:27 AM 05/29/2022   11:16 AM  Depression screen PHQ 2/9  Decreased Interest 0 0 0 0 0  Down, Depressed, Hopeless 0 0 0 0 0  PHQ - 2 Score 0 0 0 0 0  Altered sleeping 1  0    Tired, decreased energy 0  0    Change in appetite 0  0    Feeling bad or failure about yourself  0  0    Trouble concentrating 0  0    Moving slowly or fidgety/restless 0  0    Suicidal thoughts 0  0    PHQ-9 Score 1  0    Difficult doing work/chores Not difficult at all  Not difficult at all         10/31/2021   10:03 AM 11/28/2021   10:03 AM 05/29/2022   11:16 AM 05/29/2022   11:27 AM 07/30/2022   11:58 AM  Fall Risk  Falls in the past year? 0 0 0 0 1  Was there an injury with Fall? 0 0 0 0 1  Was there an injury with Fall? - Comments     broke clavical happened 4 weeks  Fall Risk Category Calculator 0 0 0 0 2  Fall Risk Category (Retired) Low Low Low Low   (RETIRED) Patient Fall Risk Level Low fall risk Low fall risk Low fall risk Low fall risk   Patient at Risk for Falls Due to No Fall Risks No Fall Risks Other (Comment) No Fall Risks   Fall risk Follow up Falls evaluation completed Falls evaluation completed Falls evaluation completed Falls evaluation completed      SUMMARY AND PLAN:  Encounter for Medicare annual wellness exam -see below  Essential (primary) hypertension -she feels is a fluke, reports feels fine -denies recent dietary changes or med changes, could be related to recent pain from fall, seeing ortho tomorrow -advised of BP goals, she opted to monitor on home cuff, agrees to call for inperson visit if continues to run high this week -dietary recommendations for HTN provided, low sodium whole foods plant heavy diet advised   Discussed applicable health maintenance/preventive health measures and advised and referred or ordered per patient preferences:  Health Maintenance  Topic Date Due   COVID-19 Vaccine (6 - 2023-24 season) 04/25/2023  (Originally 06/13/2022)   MAMMOGRAM  09/30/2022   Medicare Annual Wellness (AWV)  07/31/2023   COLONOSCOPY (Pts 45-30yr Insurance coverage will need to be confirmed)  10/20/2031   DTaP/Tdap/Td (4 - Td or Tdap) 05/20/2032   Pneumonia Vaccine 72 Years old  Completed   INFLUENZA VACCINE  Completed   DEXA SCAN  Completed   Hepatitis C Screening  Completed   Zoster Vaccines- Shingrix  Completed   HPV VACCINES  Aged Out   She had her bone density in 2023  Education and counseling on the following was provided based on the above review of health and a plan/checklist for the patient, along with additional information discussed, was provided for the patient in the patient instructions :  -Advised and counseled on a whole foods based healthy low sodium  diet and regular exercise: discussed a heart healthy whole foods based diet at length. A summary of a healthy diet was provided in the Patient Instructions -Recommended regular exercise and discussed guidelines and balance exercises. Fall was a fluke as her two large dogs pulled her suddenly, she reports her balance is good.  -Advise yearly dental visits at minimum and regular eye exams -Advised and counseled on alcohol safer limits.   Follow up: see patient instructions     There are no Patient Instructions on file for this visit.  HLucretia Kern DO

## 2022-07-31 ENCOUNTER — Ambulatory Visit: Payer: Medicare Other

## 2022-08-21 ENCOUNTER — Ambulatory Visit
Admission: RE | Admit: 2022-08-21 | Discharge: 2022-08-21 | Disposition: A | Discharge status: Home / Self Care | Payer: Medicare Other | Source: Ambulatory Visit | Attending: Obstetrics and Gynecology | Admitting: Obstetrics and Gynecology

## 2022-08-21 DIAGNOSIS — Z1231 Encounter for screening mammogram for malignant neoplasm of breast: Secondary | ICD-10-CM

## 2022-09-15 ENCOUNTER — Other Ambulatory Visit: Payer: Self-pay | Admitting: Family Medicine

## 2022-09-15 DIAGNOSIS — F3341 Major depressive disorder, recurrent, in partial remission: Secondary | ICD-10-CM

## 2022-09-15 DIAGNOSIS — M25561 Pain in right knee: Secondary | ICD-10-CM

## 2022-11-10 ENCOUNTER — Encounter: Payer: Self-pay | Admitting: Family Medicine

## 2022-11-10 DIAGNOSIS — N390 Urinary tract infection, site not specified: Secondary | ICD-10-CM

## 2022-11-11 NOTE — Telephone Encounter (Signed)
Okay for referral?

## 2022-11-27 ENCOUNTER — Encounter: Payer: Medicare Other | Admitting: Family Medicine

## 2022-12-09 ENCOUNTER — Other Ambulatory Visit: Payer: Self-pay

## 2022-12-09 DIAGNOSIS — N390 Urinary tract infection, site not specified: Secondary | ICD-10-CM

## 2022-12-10 ENCOUNTER — Encounter: Payer: Self-pay | Admitting: Urology

## 2022-12-10 ENCOUNTER — Other Ambulatory Visit
Admission: RE | Admit: 2022-12-10 | Discharge: 2022-12-10 | Disposition: A | Discharge status: Home / Self Care | Payer: Medicare Other | Attending: Urology | Admitting: Urology

## 2022-12-10 ENCOUNTER — Ambulatory Visit (INDEPENDENT_AMBULATORY_CARE_PROVIDER_SITE_OTHER): Payer: Medicare Other | Admitting: Urology

## 2022-12-10 VITALS — BP 121/74 | HR 88 | Ht 64.0 in | Wt 145.0 lb

## 2022-12-10 DIAGNOSIS — N390 Urinary tract infection, site not specified: Secondary | ICD-10-CM

## 2022-12-10 DIAGNOSIS — Z87440 Personal history of urinary (tract) infections: Secondary | ICD-10-CM | POA: Diagnosis not present

## 2022-12-10 DIAGNOSIS — R310 Gross hematuria: Secondary | ICD-10-CM

## 2022-12-10 LAB — URINALYSIS, COMPLETE (UACMP) WITH MICROSCOPIC
Bilirubin Urine: NEGATIVE
Glucose, UA: NEGATIVE mg/dL
Hgb urine dipstick: NEGATIVE
Ketones, ur: NEGATIVE mg/dL
Nitrite: NEGATIVE
Protein, ur: NEGATIVE mg/dL
Specific Gravity, Urine: 1.015 (ref 1.005–1.030)
pH: 7 (ref 5.0–8.0)

## 2022-12-10 MED ORDER — ESTRADIOL 0.1 MG/GM VA CREA: TOPICAL_CREAM | VAGINAL | 11 refills | Status: DC

## 2022-12-10 MED ORDER — TRIMETHOPRIM 100 MG PO TABS: 100.0000 mg | ORAL_TABLET | Freq: Every day | ORAL | 0 refills | Status: DC

## 2022-12-10 NOTE — Progress Notes (Unsigned)
HPI: Ms.Kaileia Deniese Aubry is a 72 y.o. female, who is here today for her routine physical.  Last CPE: 08/28/20  Regular exercise 3 or more time per week: *** Following a healthy diet: ***  Chronic medical problems: ***  Immunization History  Administered Date(s) Administered   Fluad Quad(high Dose 65+) 03/28/2021   Influenza, High Dose Seasonal PF 03/18/2016, 06/03/2017, 06/29/2018, 03/28/2019, 04/20/2020   Influenza-Unspecified 04/18/2022   Moderna Covid-19 Vaccine Bivalent Booster 54yrs & up 03/28/2021   Moderna Sars-Covid-2 Vaccination 08/07/2019, 09/04/2019, 04/20/2020, 04/18/2022   Pneumococcal Conjugate-13 08/14/2020   Pneumococcal Polysaccharide-23 06/25/2017   Tdap 12/08/2007, 12/24/2017, 05/20/2022   Zoster Recombinat (Shingrix) 07/02/2021, 10/26/2021   Health Maintenance  Topic Date Due   COVID-19 Vaccine (6 - 2023-24 season) 04/25/2023 (Originally 06/13/2022)   INFLUENZA VACCINE  01/23/2023   Medicare Annual Wellness (AWV)  07/31/2023   MAMMOGRAM  08/21/2024   Colonoscopy  10/20/2031   DTaP/Tdap/Td (4 - Td or Tdap) 05/20/2032   Pneumonia Vaccine 56+ Years old  Completed   DEXA SCAN  Completed   Hepatitis C Screening  Completed   Zoster Vaccines- Shingrix  Completed   HPV VACCINES  Aged Out    She has *** concerns today.  Review of Systems  Current Outpatient Medications on File Prior to Visit  Medication Sig Dispense Refill   benazepril (LOTENSIN) 10 MG tablet TAKE 1 TABLET(10 MG) BY MOUTH DAILY 90 tablet 1   cholecalciferol (VITAMIN D) 1000 UNITS tablet Take 1,000 Units by mouth daily.      desonide (DESOWEN) 0.05 % cream Apply topically 2 (two) times daily. 30 g 1   DULoxetine (CYMBALTA) 60 MG capsule TAKE 1 CAPSULE(60 MG) BY MOUTH DAILY 90 capsule 1   estradiol (ESTRACE) 0.1 MG/GM vaginal cream Estrogen Cream Instruction Discard applicator Apply pea sized amount to tip of finger to urethra before bed. Wash hands well after application. Use Monday,  Wednesday and Friday 42.5 g 11   risedronate (ACTONEL) 150 MG tablet TAKE 1 TABLET BY MOUTH EVERY MONTH WITH WATER AND ON AN EMPTY STOMACH. DO NOT LIE DOWN FOR NEXT 30 MINUTES AFTER TAKING* 3 tablet 3   trimethoprim (TRIMPEX) 100 MG tablet Take 1 tablet (100 mg total) by mouth daily. 90 tablet 0   No current facility-administered medications on file prior to visit.    Past Medical History:  Diagnosis Date   Cancer (HCC) 2018   basal cell--chest and left ear   Oral lichen planus    --hx of oral--pt. states for years   Osteoporosis 03-2012   T-score 1.5/2.8     Past Surgical History:  Procedure Laterality Date   RETINAL TEAR REPAIR CRYOTHERAPY Right    right foot surgery Right 03/23/2018   salivatory gland  1990   Blocked salivatory gland removed   SKIN CANCER EXCISION     TONSILLECTOMY AND ADENOIDECTOMY  Age 36   TUBAL LIGATION  1980    No Known Allergies  Family History  Problem Relation Age of Onset   Hypertension Mother    Atrial fibrillation Mother    Dementia Mother    Hypertension Father    Cancer Father        colon   Neuropathy Sister    Diabetes Maternal Grandmother    Cancer Paternal Grandmother     Social History   Socioeconomic History   Marital status: Married    Spouse name: Not on file   Number of children: 2   Years of education: 104  Highest education level: Bachelor's degree (e.g., BA, AB, BS)  Occupational History   Occupation: retired  Tobacco Use   Smoking status: Former    Types: Cigarettes    Quit date: 01/26/1988    Years since quitting: 34.8    Passive exposure: Past   Smokeless tobacco: Never  Vaping Use   Vaping Use: Never used  Substance and Sexual Activity   Alcohol use: Yes    Alcohol/week: 2.0 standard drinks of alcohol    Types: 2 Glasses of wine per week   Drug use: No   Sexual activity: Not Currently    Partners: Male    Birth control/protection: Surgical    Comment: BTL  Other Topics Concern   Not on file  Social  History Narrative   HH 3   3 dogs   Retired Scientist, research (physical sciences)   2 grandchildren   Social Determinants of Health   Financial Resource Strain: Low Risk  (10/29/2021)   Overall Financial Resource Strain (CARDIA)    Difficulty of Paying Living Expenses: Not hard at all  Food Insecurity: No Food Insecurity (10/29/2021)   Hunger Vital Sign    Worried About Running Out of Food in the Last Year: Never true    Ran Out of Food in the Last Year: Never true  Transportation Needs: No Transportation Needs (10/29/2021)   PRAPARE - Administrator, Civil Service (Medical): No    Lack of Transportation (Non-Medical): No  Physical Activity: Unknown (10/29/2021)   Exercise Vital Sign    Days of Exercise per Week: 0 days    Minutes of Exercise per Session: Not on file  Recent Concern: Physical Activity - Inactive (10/29/2021)   Exercise Vital Sign    Days of Exercise per Week: 0 days    Minutes of Exercise per Session: 0 min  Stress: No Stress Concern Present (10/29/2021)   [employer] of Occupational Health - Occupational Stress Questionnaire    Feeling of Stress : Not at all  Social Connections: Moderately Isolated (10/29/2021)   Social Connection and Isolation Panel [NHANES]    Frequency of Communication with Friends and Family: Twice a week    Frequency of Social Gatherings with Friends and Family: Once a week    Attends Religious Services: Never    Database administrator or Organizations: No    Attends Engineer, structural: Not on file    Marital Status: Married    There were no vitals filed for this visit. There is no height or weight on file to calculate BMI.  Wt Readings from Last 3 Encounters:  12/10/22 145 lb (65.8 kg)  07/30/22 142 lb (64.4 kg)  05/29/22 142 lb 6 oz (64.6 kg)    Physical Exam  ASSESSMENT AND PLAN: Ms. Tarica Vanalst was here today annual physical examination.  No orders of the defined types were placed in this encounter.   There are no diagnoses  linked to this encounter.  There are no diagnoses linked to this encounter.  No follow-ups on file.  Wrigley Plasencia G. [place], MD  [facility]. [place] office.

## 2022-12-10 NOTE — Progress Notes (Signed)
12/10/22 9:22 AM   Larene Beach Marcelino Duster May 18, 1951 [id number]  CC: Recurrent UTI, gross hematuria  HPI: 72 year old female who reports at least 5 recurrent E. coli UTIs over the last 4 months, starting in February 2024.  She denies any UTIs prior to that episode.  Cultures are all documented in care everywhere and are from [facility].  She was originally treated with Augmentin, and most recently treated with Macrobid x 2.  UTI symptoms are dysuria and urinary frequency, as well as hematuria.  Denies any urinary symptoms today.  She previously was on topical estrogen cream but stopped this about 6 months ago, she was getting this through her GYN.  She had some mild urgency, rare urge incontinence during the time of infections but that seems to resolved.  She denies any gross hematuria none at the time of UTI.  No recent cross-sectional imaging to review.  PMH: Past Medical History:  Diagnosis Date   Cancer (HCC) 2018   basal cell--chest and left ear   Oral lichen planus    --hx of oral--pt. states for years   Osteoporosis 03-2012   T-score 1.5/2.8     Surgical History: Past Surgical History:  Procedure Laterality Date   RETINAL TEAR REPAIR CRYOTHERAPY Right    right foot surgery Right 03/23/2018   salivatory gland  1990   Blocked salivatory gland removed   SKIN CANCER EXCISION     TONSILLECTOMY AND ADENOIDECTOMY  Age 15   TUBAL LIGATION  1980    Family History: Family History  Problem Relation Age of Onset   Hypertension Mother    Atrial fibrillation Mother    Dementia Mother    Hypertension Father    Cancer Father        colon   Neuropathy Sister    Diabetes Maternal Grandmother    Cancer Paternal Grandmother     Social History:  reports that she quit smoking about 34 years ago. Her smoking use included cigarettes. She has been exposed to tobacco smoke. She has never used smokeless tobacco. She reports current alcohol use of about 2.0 standard drinks of alcohol  per week. She reports that she does not use drugs.  Physical Exam: BP 121/74   Pulse 88   Ht 5\' 4"  (1.626 m)   Wt 145 lb (65.8 kg)   LMP 06/24/2002 (Approximate)   BMI 24.89 kg/m    Constitutional:  Alert and oriented, No acute distress. Cardiovascular: No clubbing, cyanosis, or edema. Respiratory: Normal respiratory effort, no increased work of breathing. GI: Abdomen is soft, nontender, nondistended, no abdominal masses   Laboratory Data: Culture data reviewed in care everywhere  Assessment & Plan:   72 year old female with recurrent UTIs over the last 4 months after stopping topical estrogen cream, likely related.  Also had a few episodes of significant gross hematuria at the time of infection.  We discussed the evaluation and treatment of patients with recurrent UTIs at length.  We specifically discussed the differences between asymptomatic bacteriuria and true urinary tract infection.  We discussed the AUA definition of recurrent UTI of at least 2 culture proven symptomatic acute cystitis episodes in a 51-month period, or 3 within a 1 year period.  We discussed the importance of culture directed antibiotic treatment, and antibiotic stewardship.  First-line therapy includes nitrofurantoin(5 days), Bactrim(3 days), or fosfomycin(3 g single dose).  Possible etiologies of recurrent infection include periurethral tissue atrophy in postmenopausal woman, constipation, sexual activity, incomplete emptying, anatomic abnormalities, and even genetic predisposition.  Finally, we discussed the role of perineal hygiene, timed voiding, adequate hydration, topical vaginal estrogen, cranberry prophylaxis, and low-dose antibiotic prophylaxis.  Renal ultrasound for further evaluation of her hematuria and recurrent UTI, call with results Resume topical estrogen cream, cranberry tablet prophylaxis, trimethoprim prophylaxis x 90 days RTC 4 months symptom check   Legrand Rams, MD 12/10/2022  [facility]  Urology [address], [address] [place], [place] [address] [phone]

## 2022-12-11 ENCOUNTER — Encounter: Payer: Self-pay | Admitting: Family Medicine

## 2022-12-11 ENCOUNTER — Ambulatory Visit (INDEPENDENT_AMBULATORY_CARE_PROVIDER_SITE_OTHER): Payer: Medicare Other | Admitting: Family Medicine

## 2022-12-11 VITALS — BP 110/70 | HR 94 | Resp 16 | Ht 64.0 in | Wt 145.0 lb

## 2022-12-11 DIAGNOSIS — E782 Mixed hyperlipidemia: Secondary | ICD-10-CM | POA: Diagnosis not present

## 2022-12-11 DIAGNOSIS — M810 Age-related osteoporosis without current pathological fracture: Secondary | ICD-10-CM

## 2022-12-11 DIAGNOSIS — M25562 Pain in left knee: Secondary | ICD-10-CM

## 2022-12-11 DIAGNOSIS — M25561 Pain in right knee: Secondary | ICD-10-CM

## 2022-12-11 DIAGNOSIS — F3341 Major depressive disorder, recurrent, in partial remission: Secondary | ICD-10-CM

## 2022-12-11 DIAGNOSIS — Z0000 Encounter for general adult medical examination without abnormal findings: Secondary | ICD-10-CM

## 2022-12-11 DIAGNOSIS — I10 Essential (primary) hypertension: Secondary | ICD-10-CM

## 2022-12-11 LAB — CBC
HCT: 40.8 % (ref 36.0–46.0)
Hemoglobin: 13.6 g/dL (ref 12.0–15.0)
MCHC: 33.2 g/dL (ref 30.0–36.0)
MCV: 89.2 fl (ref 78.0–100.0)
Platelets: 282 10*3/uL (ref 150.0–400.0)
RBC: 4.58 Mil/uL (ref 3.87–5.11)
RDW: 13.2 % (ref 11.5–15.5)
WBC: 4.3 10*3/uL (ref 4.0–10.5)

## 2022-12-11 LAB — LIPID PANEL
Cholesterol: 211 mg/dL — ABNORMAL HIGH (ref 0–200)
HDL: 58.1 mg/dL (ref >39.00)
LDL Cholesterol: 123 mg/dL — ABNORMAL HIGH (ref 0–99)
NonHDL: 153.09
Total CHOL/HDL Ratio: 4
Triglycerides: 149 mg/dL (ref 0.0–149.0)
VLDL: 29.8 mg/dL (ref 0.0–40.0)

## 2022-12-11 LAB — HEPATIC FUNCTION PANEL
ALT: 22 U/L (ref 0–35)
AST: 23 U/L (ref 0–37)
Albumin: 4.1 g/dL (ref 3.5–5.2)
Alkaline Phosphatase: 60 U/L (ref 39–117)
Bilirubin, Direct: 0.1 mg/dL (ref 0.0–0.3)
Total Bilirubin: 0.4 mg/dL (ref 0.2–1.2)
Total Protein: 7.2 g/dL (ref 6.0–8.3)

## 2022-12-11 LAB — BASIC METABOLIC PANEL
BUN: 13 mg/dL (ref 6–23)
CO2: 29 mEq/L (ref 19–32)
Calcium: 8.8 mg/dL (ref 8.4–10.5)
Chloride: 102 mEq/L (ref 96–112)
Creatinine, Ser: 0.84 mg/dL (ref 0.40–1.20)
GFR: 69.2 mL/min (ref >60.00)
Glucose, Bld: 98 mg/dL (ref 70–99)
Potassium: 4.4 mEq/L (ref 3.5–5.1)
Sodium: 139 mEq/L (ref 135–145)

## 2022-12-11 MED ORDER — BENAZEPRIL HCL 10 MG PO TABS
10.0000 mg | ORAL_TABLET | Freq: Every day | ORAL | 3 refills | Status: DC
Start: 2022-12-11 — End: 2023-12-29

## 2022-12-11 MED ORDER — DULOXETINE HCL 60 MG PO CPEP
ORAL_CAPSULE | ORAL | 3 refills | Status: DC
Start: 2022-12-11 — End: 2023-03-17

## 2022-12-11 NOTE — Assessment & Plan Note (Signed)
Problem has been well-controlled with duloxetine 60 mg daily.  Symptoms have reoccurred when she has tried to discontinue medication. No changes in current management.

## 2022-12-11 NOTE — Assessment & Plan Note (Signed)
BP adequately controlled. Continue benazepril 10 mg daily and low-salt diet. Eye exam is current. Continue monitoring BP regularly. If blood work has not seen apical abnormalities and BP is stable at home, it is appropriate to continue annual follow-ups.

## 2022-12-11 NOTE — Assessment & Plan Note (Addendum)
We discussed the importance of regular physical activity and healthy diet for prevention of chronic illness and/or complications. Preventive guidelines reviewed. Vaccination up-to-date. She follows with her gynecologist annually. Ca++ through her diet and vit D supplementation to continue. Next CPE in a year.

## 2022-12-11 NOTE — Patient Instructions (Signed)
A few things to remember from today's visit:  Routine general medical examination at a health care facility  Essential (primary) hypertension  Mixed hyperlipidemia No changes today. Continue monitoring blood pressure 2 times per month. As far as everything is stable, we can continue following once per year.  If you need refills for medications you take chronically, please call your pharmacy. Do not use My Chart to request refills or for acute issues that need immediate attention. If you send a my chart message, it may take a few days to be addressed, specially if I am not in the office.  Please be sure medication list is accurate. If a new problem present, please set up appointment sooner than planned today.

## 2022-12-11 NOTE — Assessment & Plan Note (Signed)
Currently on Actonel 150 mg monthly. Weightbearing exercises 2-3 times per week recommended. Continue adequate calcium intake and vitamin D supplementation as well as fall prevention. Following with oncologist.

## 2022-12-11 NOTE — Assessment & Plan Note (Signed)
Continue nonpharmacologic treatment. Further recommendations will be given according to 10 years CVD risk score and lipid panel numbers. 

## 2022-12-11 NOTE — Assessment & Plan Note (Signed)
Problem is well controlled since she has been on Cymbalta 60 mg.  In the past, she has tried to stop medication and pain reoccured. No changes in current management.

## 2022-12-16 ENCOUNTER — Ambulatory Visit (HOSPITAL_COMMUNITY)
Admission: RE | Admit: 2022-12-16 | Discharge: 2022-12-16 | Disposition: A | Discharge status: Home / Self Care | Payer: Medicare Other | Source: Ambulatory Visit | Attending: Urology | Admitting: Urology

## 2022-12-16 DIAGNOSIS — N390 Urinary tract infection, site not specified: Secondary | ICD-10-CM | POA: Diagnosis present

## 2023-03-16 ENCOUNTER — Other Ambulatory Visit: Payer: Self-pay | Admitting: Obstetrics and Gynecology

## 2023-03-16 ENCOUNTER — Other Ambulatory Visit: Payer: Self-pay | Admitting: Family Medicine

## 2023-03-16 DIAGNOSIS — F3341 Major depressive disorder, recurrent, in partial remission: Secondary | ICD-10-CM

## 2023-03-16 DIAGNOSIS — Z780 Asymptomatic menopausal state: Secondary | ICD-10-CM

## 2023-03-16 DIAGNOSIS — M25561 Pain in right knee: Secondary | ICD-10-CM

## 2023-03-18 NOTE — Telephone Encounter (Signed)
Med refill request: premarin vaginal cream (formulary) Last AEX: 05/08/22 Next AEX: 05/15/23 Last MMG (if hormonal med) 08/21/22 Refill sent to provider for approval.

## 2023-03-20 ENCOUNTER — Other Ambulatory Visit: Payer: Self-pay | Admitting: Family Medicine

## 2023-03-20 DIAGNOSIS — H60543 Acute eczematoid otitis externa, bilateral: Secondary | ICD-10-CM

## 2023-03-22 ENCOUNTER — Encounter: Payer: Self-pay | Admitting: Family Medicine

## 2023-04-08 ENCOUNTER — Ambulatory Visit: Payer: Medicare Other | Admitting: Urology

## 2023-05-01 NOTE — Progress Notes (Signed)
72 y.o. G74P2002 [name] here for a breast and pelvic exam.    The patient is also followed for vaginal atrophy and osteopenia.   Had UTIs monthly at the beginning of January.  Saw urology who recommended restarting Premarin cream and an abx for a period of time.   Taking Actonel.  She stopped for 3 months due to needing a tooth pulled.  Previously took Fosamax and then transitioned to Actonel.  She states she previously had a drug holiday for 1 - 2 years.   Not sexually active.   Spending time with grandchildren.  PCP: [place], Betty G, MD   Patient's last menstrual period was 06/24/2002 (approximate).           Sexually active: No.  The current method of family planning is BTL.    Menopausal hormone therapy:  premarin Exercising: No.   Smoker:  former  OB History     Gravida  2   Para  2   Term  2   Preterm      AB      Living  2      SAB      IAB      Ectopic      Multiple      Live Births              HEALTH MAINTENANCE: Pap:      Component Value Date/Time   DIAGPAP  05/02/2021 0850    - Negative for intraepithelial lesion or malignancy (NILM)   DIAGPAP  03/29/2019 0836    - Negative for intraepithelial lesion or malignancy (NILM)   DIAGPAP  03/19/2017 0000    NEGATIVE FOR INTRAEPITHELIAL LESIONS OR MALIGNANCY.   ADEQPAP  05/02/2021 0850    Satisfactory for evaluation; transformation zone component PRESENT.   ADEQPAP  03/29/2019 0836    Satisfactory for evaluation; transformation zone component ABSENT.   ADEQPAP  03/19/2017 0000    Satisfactory for evaluation  endocervical/transformation zone component PRESENT.   History of abnormal Pap or positive HPV:  no Mammogram: 08/21/22 Breast Density Cat B, BI-RADS CAT 1 neg Colonoscopy:  10/19/21 Bone Density:  08/27/21  Result  osteopenic   Immunization History  Administered Date(s) Administered   Fluad Quad(high Dose 65+) 03/28/2021   Influenza, High Dose Seasonal PF  03/18/2016, 06/03/2017, 06/29/2018, 03/28/2019, 04/20/2020   Influenza-Unspecified 04/18/2022   Moderna Covid-19 Vaccine Bivalent Booster 2yrs & up 03/28/2021   Moderna Sars-Covid-2 Vaccination 08/07/2019, 09/04/2019, 04/20/2020, 04/18/2022   Pneumococcal Conjugate-13 08/14/2020   Pneumococcal Polysaccharide-23 06/25/2017   Tdap 12/08/2007, 12/24/2017, 05/20/2022   Zoster Recombinant(Shingrix) 07/02/2021, 10/26/2021      reports that she quit smoking about 35 years ago. Her smoking use included cigarettes. She has been exposed to tobacco smoke. She has never used smokeless tobacco. She reports current alcohol use of about 2.0 standard drinks of alcohol per week. She reports that she does not use drugs.  Past Medical History:  Diagnosis Date   Cancer (HCC) 2018   basal cell--chest and left ear   Oral lichen planus    --hx of oral--pt. states for years   Osteoporosis 03-2012   T-score 1.5/2.8     Past Surgical History:  Procedure Laterality Date   RETINAL TEAR REPAIR CRYOTHERAPY Right    right foot surgery Right 03/23/2018   salivatory gland  1990   Blocked salivatory gland removed   SKIN CANCER EXCISION     TONSILLECTOMY AND ADENOIDECTOMY  Age 8  TUBAL LIGATION  1980    Current Outpatient Medications  Medication Sig Dispense Refill   benazepril (LOTENSIN) 10 MG tablet Take 1 tablet (10 mg total) by mouth daily. 90 tablet 3   cholecalciferol (VITAMIN D) 1000 UNITS tablet Take 1,000 Units by mouth daily.      desonide (DESOWEN) 0.05 % cream Apply topically daily as needed. 30 g 1   DULoxetine (CYMBALTA) 60 MG capsule TAKE 1 CAPSULE(60 MG) BY MOUTH DAILY 90 capsule 3   estradiol (ESTRACE) 0.1 MG/GM vaginal cream Estrogen Cream Instruction Discard applicator Apply pea sized amount to tip of finger to urethra before bed. Wash hands well after application. Use Monday, Wednesday and Friday 42.5 g 11   risedronate (ACTONEL) 150 MG tablet TAKE 1 TABLET BY MOUTH EVERY MONTH WITH WATER  AND ON AN EMPTY STOMACH. DO NOT LIE DOWN FOR NEXT 30 MINUTES AFTER TAKING* 3 tablet 3   No current facility-administered medications for this visit.    ALLERGIES: Patient has no known allergies.  Family History  Problem Relation Age of Onset   Hypertension Mother    Atrial fibrillation Mother    Dementia Mother    Hypertension Father    Cancer Father        colon   Neuropathy Sister    Diabetes Maternal Grandmother    Cancer Paternal Grandmother     Review of Systems  All other systems reviewed and are negative.   PHYSICAL EXAM:  BP 128/84 (BP Location: Left Arm, Patient Position: Sitting, Cuff Size: Normal)   Pulse 94   Ht 5\' 4"  (1.626 m)   Wt 143 lb (64.9 kg)   LMP 06/24/2002 (Approximate)   SpO2 97%   BMI 24.55 kg/m     General appearance: alert, cooperative and appears stated age Head: normocephalic, without obvious abnormality, atraumatic Neck: no adenopathy, supple, symmetrical, trachea midline and thyroid normal to inspection and palpation Lungs: clear to auscultation bilaterally Breasts: normal appearance, no masses or tenderness, No nipple retraction or dimpling, No nipple discharge or bleeding, No axillary adenopathy Heart: regular rate and rhythm Abdomen: soft, non-tender; no masses, no organomegaly Extremities: extremities normal, atraumatic, no cyanosis or edema Skin: skin color, texture, turgor normal. No rashes or lesions Lymph nodes: cervical, supraclavicular, and axillary nodes normal. Neurologic: grossly normal  Pelvic: External genitalia:  no lesions              No abnormal inguinal nodes palpated.              Urethra:  normal appearing urethra with no masses, tenderness or lesions              Bartholins and Skenes: normal                 Vagina: normal appearing vagina with normal color and discharge, no lesions              Cervix: no lesions              Pap taken: No. Bimanual Exam:  Uterus:  normal size, contour, position, consistency,  mobility, non-tender              Adnexa: no mass, fullness, tenderness              Rectal exam: Yes.  .  Confirms.              Anus:  normal sphincter tone, no lesions  Chaperone was present for exam:  Warren Lacy, CMA  ASSESSMENT: Encounter for breast and pelvic exam.  Osteopenia.  Increased fracture risk.  On Actonel since 2019.  BMD stable. Atrophy of vagina. Hx UTIs resolved with restarting vaginal estrogen and course of abx.  FH colon cancer.   PLAN: Mammogram screening discussed. Self breast awareness reviewed. Pap and HRV collected:  No.  Ok to stop paps after discussing this with patient.  Guidelines for Calcium, Vitamin D, regular exercise program including cardiovascular and weight bearing exercise. Medication refills:  Estrace cream.  Instructed in use.  We reviewed benefit for vaginal health and for reducing risk of postmenopausal UTI.  I also discussed potential effect on breast cancer.  Actonel 150 mg po monthly for one year. BMD in 2025.  It may be time for a drug holiday from the Actonel after her next bone density is completed. Labs with PCP.  Follow up:  1 year and prn.     25 min  total time was spent for this patient encounter, including preparation, face-to-face counseling with the patient, coordination of care, and documentation of the encounter in addition to doing the breast and pelvic exam.   An After Visit Summary was provided to the patient.

## 2023-05-15 ENCOUNTER — Encounter: Payer: Self-pay | Admitting: Obstetrics and Gynecology

## 2023-05-15 ENCOUNTER — Ambulatory Visit (INDEPENDENT_AMBULATORY_CARE_PROVIDER_SITE_OTHER): Payer: Medicare Other | Admitting: Obstetrics and Gynecology

## 2023-05-15 VITALS — BP 128/84 | HR 94 | Ht 64.0 in | Wt 143.0 lb

## 2023-05-15 DIAGNOSIS — Z87440 Personal history of urinary (tract) infections: Secondary | ICD-10-CM | POA: Diagnosis not present

## 2023-05-15 DIAGNOSIS — N952 Postmenopausal atrophic vaginitis: Secondary | ICD-10-CM

## 2023-05-15 DIAGNOSIS — Z5181 Encounter for therapeutic drug level monitoring: Secondary | ICD-10-CM

## 2023-05-15 DIAGNOSIS — M8580 Other specified disorders of bone density and structure, unspecified site: Secondary | ICD-10-CM | POA: Diagnosis not present

## 2023-05-15 DIAGNOSIS — Z01419 Encounter for gynecological examination (general) (routine) without abnormal findings: Secondary | ICD-10-CM | POA: Diagnosis not present

## 2023-05-15 MED ORDER — ESTRADIOL 0.1 MG/GM VA CREA: TOPICAL_CREAM | VAGINAL | 1 refills | Status: DC

## 2023-05-15 MED ORDER — RISEDRONATE SODIUM 150 MG PO TABS: ORAL_TABLET | ORAL | 3 refills | Status: AC

## 2023-05-15 NOTE — Patient Instructions (Signed)

## 2023-05-16 ENCOUNTER — Other Ambulatory Visit: Payer: Self-pay | Admitting: Medical Genetics

## 2023-05-16 DIAGNOSIS — Z006 Encounter for examination for normal comparison and control in clinical research program: Secondary | ICD-10-CM

## 2023-06-20 ENCOUNTER — Other Ambulatory Visit (HOSPITAL_COMMUNITY)
Admission: RE | Admit: 2023-06-20 | Discharge: 2023-06-20 | Disposition: A | Discharge status: Home / Self Care | Payer: Medicare Other | Source: Ambulatory Visit | Attending: Oncology | Admitting: Oncology

## 2023-06-20 DIAGNOSIS — Z006 Encounter for examination for normal comparison and control in clinical research program: Secondary | ICD-10-CM | POA: Insufficient documentation

## 2023-07-07 LAB — GENECONNECT MOLECULAR SCREEN: Genetic Analysis Overall Interpretation: NEGATIVE

## 2023-08-03 NOTE — Progress Notes (Signed)
This encounter was created in error - please disregard.

## 2023-09-05 ENCOUNTER — Ambulatory Visit: Payer: Medicare Other

## 2023-09-05 VITALS — BP 120/60 | HR 78 | Temp 97.9°F | Ht 64.0 in | Wt 144.0 lb

## 2023-09-05 DIAGNOSIS — Z0000 Encounter for general adult medical examination without abnormal findings: Secondary | ICD-10-CM

## 2023-09-05 NOTE — Patient Instructions (Addendum)
 [name] , Thank you for taking time to come for your Medicare Wellness Visit. I appreciate your ongoing commitment to your health goals. Please review the following plan we discussed and let me know if I can assist you in the future.   Referrals/Orders/Follow-Ups/Clinician Recommendations:   This is a list of the screening recommended for you and due dates:  Health Maintenance  Topic Date Due   COVID-19 Vaccine (6 - 2024-25 season) 02/23/2023   Mammogram  08/21/2024   Medicare Annual Wellness Visit  09/04/2024   Colon Cancer Screening  10/20/2031   DTaP/Tdap/Td vaccine (4 - Td or Tdap) 05/20/2032   Pneumonia Vaccine  Completed   Flu Shot  Completed   DEXA scan (bone density measurement)  Completed   Hepatitis C Screening  Completed   Zoster (Shingles) Vaccine  Completed   HPV Vaccine  Aged Out    Advanced directives: (Copy Requested) Please bring a copy of your health care power of attorney and living will to the office to be added to your chart at your convenience. You can mail to [facility] [address] [place], [place] [address] or email to [email or web address]  Next Medicare Annual Wellness Visit scheduled for next year: Yes

## 2023-09-05 NOTE — Progress Notes (Signed)
 Subjective:   Hannah Jennings is a 73 y.o. who presents for a Medicare Wellness preventive visit.  Visit Complete: In person  VideoDeclined- This patient declined Interactive audio and Acupuncturist. Therefore the visit was completed with audio only.  Persons Participating in Visit: Patient.  AWV Questionnaire: Yes: Patient Medicare AWV questionnaire was completed by the patient on 09/01/23; I have confirmed that all information answered by patient is correct and no changes since this date.  Cardiac Risk Factors include: advanced age (>81men, >78 women);hypertension     Objective:    Today's Vitals   09/05/23 0839 09/05/23 0850  BP: 120/60 120/60  Pulse: 78 78  Temp: 97.9 F (36.6 C) 97.9 F (36.6 C)  TempSrc: Oral Oral  SpO2:  97%  Weight: 144 lb (65.3 kg) 144 lb (65.3 kg)  Height: 5\' 4"  (1.626 m) 5\' 4"  (1.626 m)   Body mass index is 24.72 kg/m.     09/05/2023    8:59 AM 06/30/2022   10:14 AM 07/17/2021    1:27 PM 08/14/2020    8:38 AM 07/21/2019    4:11 PM  Advanced Directives  Does Patient Have a Medical Advance Directive? Yes No Yes Yes Yes  Type of Estate agent of [place];Living will  Living will Healthcare Power of [place];Living will Healthcare Power of [place];Living will  Does patient want to make changes to medical advance directive?    No - Patient declined No - Patient declined  Copy of Healthcare Power of Attorney in Chart? No - copy requested   No - copy requested No - copy requested    Current Medications (verified) Outpatient Encounter Medications as of 09/05/2023  Medication Sig   benazepril (LOTENSIN) 10 MG tablet Take 1 tablet (10 mg total) by mouth daily.   cholecalciferol (VITAMIN D) 1000 UNITS tablet Take 1,000 Units by mouth daily.    desonide (DESOWEN) 0.05 % cream Apply topically daily as needed.   DULoxetine (CYMBALTA) 60 MG capsule TAKE 1 CAPSULE(60 MG) BY MOUTH DAILY   estradiol (ESTRACE) 0.1 MG/GM  vaginal cream Place 1/2 gram in the vagina and a pea size amount to the urethra at bedtime two to three times a week.   risedronate (ACTONEL) 150 MG tablet TAKE 1 TABLET BY MOUTH EVERY MONTH WITH WATER AND ON AN EMPTY STOMACH. DO NOT LIE DOWN FOR NEXT 30 MINUTES AFTER TAKING*   No facility-administered encounter medications on file as of 09/05/2023.    Allergies (verified) Patient has no known allergies.   History: Past Medical History:  Diagnosis Date   Cancer (HCC) 2018   basal cell--chest and left ear   Oral lichen planus    --hx of oral--pt. states for years   Osteoporosis 03-2012   T-score 1.5/2.8    Past Surgical History:  Procedure Laterality Date   RETINAL TEAR REPAIR CRYOTHERAPY Right    right foot surgery Right 03/23/2018   salivatory gland  1990   Blocked salivatory gland removed   SKIN CANCER EXCISION     TONSILLECTOMY AND ADENOIDECTOMY  Age 40   TUBAL LIGATION  1980   Family History  Problem Relation Age of Onset   Hypertension Mother    Atrial fibrillation Mother    Dementia Mother    Hypertension Father    Cancer Father        colon   Neuropathy Sister    Diabetes Maternal Grandmother    Cancer Paternal Grandmother    Social History   Socioeconomic  History   Marital status: Married    Spouse name: Not on file   Number of children: 2   Years of education: 16   Highest education level: Bachelor's degree (e.g., BA, AB, BS)  Occupational History   Occupation: retired  Tobacco Use   Smoking status: Former    Current packs/day: 0.00    Types: Cigarettes    Quit date: 01/26/1988    Years since quitting: 35.6    Passive exposure: Past   Smokeless tobacco: Never  Vaping Use   Vaping status: Never Used  Substance and Sexual Activity   Alcohol use: Yes    Alcohol/week: 2.0 standard drinks of alcohol    Types: 2 Glasses of wine per week   Drug use: No   Sexual activity: Not Currently    Partners: Male    Birth control/protection: Surgical     Comment: BTL- less than 5, after 16, no abnormal pap, no STD, no DES  Other Topics Concern   Not on file  Social History Narrative   HH 3   3 dogs   Retired Scientist, research (physical sciences)   2 grandchildren   Social Drivers of Corporate investment banker Strain: Low Risk  (09/05/2023)   Overall Financial Resource Strain (CARDIA)    Difficulty of Paying Living Expenses: Not hard at all  Food Insecurity: No Food Insecurity (09/05/2023)   Hunger Vital Sign    Worried About Running Out of Food in the Last Year: Never true    Ran Out of Food in the Last Year: Never true  Transportation Needs: No Transportation Needs (09/05/2023)   PRAPARE - Administrator, Civil Service (Medical): No    Lack of Transportation (Non-Medical): No  Physical Activity: Unknown (09/05/2023)   Exercise Vital Sign    Days of Exercise per Week: 0 days    Minutes of Exercise per Session: Not on file  Stress: No Stress Concern Present (09/05/2023)   [employer] of Occupational Health - Occupational Stress Questionnaire    Feeling of Stress : Not at all  Social Connections: Moderately Isolated (09/05/2023)   Social Connection and Isolation Panel [NHANES]    Frequency of Communication with Friends and Family: Twice a week    Frequency of Social Gatherings with Friends and Family: Once a week    Attends Religious Services: Never    Database administrator or Organizations: No    Attends Engineer, structural: Not on file    Marital Status: Married    Tobacco Counseling Counseling given: Not Answered    Clinical Intake:  Pre-visit preparation completed: Yes  Pain : No/denies pain     BMI - recorded: 24.72 Nutritional Status: BMI of 19-24  Normal Nutritional Risks: None Diabetes: No  How often do you need to have someone help you when you read instructions, pamphlets, or other written materials from your doctor or pharmacy?: 1 - Never  Interpreter Needed?: No  Information entered by :: Theresa Mulligan  LPN   Activities of Daily Living     09/05/2023    8:56 AM 09/01/2023    6:28 PM  In your present state of health, do you have any difficulty performing the following activities:  Hearing? 0 0  Vision? 0 0  Difficulty concentrating or making decisions? 0 0  Walking or climbing stairs? 0 0  Dressing or bathing? 0 0  Doing errands, shopping? 0 0  Preparing Food and eating ? N N  Using the Toilet?  N N  In the past six months, have you accidently leaked urine? Malvin Johns  Comment Wears Pads.Followed by medcal attention   Do you have problems with loss of bowel control? N N  Managing your Medications? N N  Managing your Finances? N N  Housekeeping or managing your Housekeeping? N N    Patient Care Team: [place], Betty G, MD as PCP - General (Family Medicine) Ardell Isaacs, Forrestine Him, MD as Consulting Physician (Obstetrics and Gynecology)  Indicate any recent Medical Services you may have received from other than [facility] providers in the past year (date may be approximate).     Assessment:   This is a routine wellness examination for Jeanne.  Hearing/Vision screen Hearing Screening - Comments:: Denies hearing difficulties   Vision Screening - Comments:: Wears resding glasses - up to date with routine eye exams with  Dr Randon Goldsmith   Goals Addressed               This Visit's Progress     Increase physical activity (pt-stated)        Lose weght       Depression Screen     09/05/2023    9:01 AM 05/15/2023    7:57 AM 12/11/2022    7:55 AM 07/30/2022   12:00 PM 07/30/2022   11:59 AM 05/30/2022   10:01 PM 05/29/2022   11:27 AM  PHQ 2/9 Scores  PHQ - 2 Score 0 0 0 0 0 0 0  PHQ- 9 Score   0 1  0     Fall Risk     09/05/2023    8:58 AM 09/01/2023    6:28 PM 07/30/2022   11:58 AM 05/29/2022   11:27 AM 05/29/2022   11:16 AM  Fall Risk   Falls in the past year? 0 0 1 0 0  Number falls in past yr: 0 0 0 0 0  Injury with Fall? 0 0 1 0 0  Comment   broke clavical happened 4 weeks    Risk  for fall due to : No Fall Risks   No Fall Risks Other (Comment)  Follow up Falls prevention discussed;Falls evaluation completed   Falls evaluation completed Falls evaluation completed    MEDICARE RISK AT HOME:  Medicare Risk at Home Any stairs in or around the home?: Yes If so, are there any without handrails?: No Home free of loose throw rugs in walkways, pet beds, electrical cords, etc?: No Life alert?: No Use of a cane, walker or w/c?: No Grab bars in the bathroom?: Yes Shower chair or bench in shower?: No Elevated toilet seat or a handicapped toilet?: No  TIMED UP AND GO:  Was the test performed?  No  Cognitive Function: 6CIT completed        09/05/2023    8:59 AM 07/17/2021    1:29 PM 07/21/2019    4:16 PM  6CIT Screen  What Year? 0 points 0 points 0 points  What month? 0 points 0 points 0 points  What time? 0 points 0 points 0 points  Count back from 20 0 points 0 points 0 points  Months in reverse 0 points 0 points 0 points  Repeat phrase 0 points 0 points 0 points  Total Score 0 points 0 points 0 points    Immunizations Immunization History  Administered Date(s) Administered   Fluad Quad(high Dose 65+) 03/28/2021   Influenza, High Dose Seasonal PF 03/18/2016, 06/03/2017, 06/29/2018, 03/28/2019, 04/20/2020   Influenza-Unspecified  04/18/2022, 03/04/2023   Moderna Covid-19 Vaccine Bivalent Booster 70yrs & up 03/28/2021   Moderna Sars-Covid-2 Vaccination 08/07/2019, 09/04/2019, 04/20/2020, 04/18/2022   Pneumococcal Conjugate-13 08/14/2020   Pneumococcal Polysaccharide-23 06/25/2017   Tdap 12/08/2007, 12/24/2017, 05/20/2022   Zoster Recombinant(Shingrix) 07/02/2021, 10/26/2021    Screening Tests Health Maintenance  Topic Date Due   COVID-19 Vaccine (6 - 2024-25 season) 02/23/2023   MAMMOGRAM  08/21/2024   Medicare Annual Wellness (AWV)  09/04/2024   Colonoscopy  10/20/2031   DTaP/Tdap/Td (4 - Td or Tdap) 05/20/2032   Pneumonia Vaccine 14+ Years old   Completed   INFLUENZA VACCINE  Completed   DEXA SCAN  Completed   Hepatitis C Screening  Completed   Zoster Vaccines- Shingrix  Completed   HPV VACCINES  Aged Out    Health Maintenance  Health Maintenance Due  Topic Date Due   COVID-19 Vaccine (6 - 2024-25 season) 02/23/2023   Health Maintenance Items Addressed:    Additional Screening:  Vision Screening: Recommended annual ophthalmology exams for early detection of glaucoma and other disorders of the eye.  Dental Screening: Recommended annual dental exams for proper oral hygiene  Community Resource Referral / Chronic Care Management: CRR required this visit?  No   CCM required this visit?  No     Plan:     I have personally reviewed and noted the following in the patient's chart:   Medical and social history Use of alcohol, tobacco or illicit drugs  Current medications and supplements including opioid prescriptions. Patient is not currently taking opioid prescriptions. Functional ability and status Nutritional status Physical activity Advanced directives List of other physicians Hospitalizations, surgeries, and ER visits in previous 12 months Vitals Screenings to include cognitive, depression, and falls Referrals and appointments  In addition, I have reviewed and discussed with patient certain preventive protocols, quality metrics, and best practice recommendations. A written personalized care plan for preventive services as well as general preventive health recommendations were provided to patient.     Tillie Rung, LPN   [id number]/40/3474   After Visit Summary: (In Person-Declined) Patient declined AVS at this time.  Notes: Nothing significant to report at this time.

## 2023-09-22 ENCOUNTER — Other Ambulatory Visit: Payer: Self-pay | Admitting: Obstetrics and Gynecology

## 2023-09-22 DIAGNOSIS — Z1231 Encounter for screening mammogram for malignant neoplasm of breast: Secondary | ICD-10-CM

## 2023-12-12 ENCOUNTER — Other Ambulatory Visit: Payer: Self-pay

## 2023-12-12 ENCOUNTER — Ambulatory Visit (INDEPENDENT_AMBULATORY_CARE_PROVIDER_SITE_OTHER): Payer: Medicare Other | Admitting: Family Medicine

## 2023-12-12 ENCOUNTER — Encounter: Payer: Self-pay | Admitting: Family Medicine

## 2023-12-12 ENCOUNTER — Ambulatory Visit: Payer: Self-pay | Admitting: Family Medicine

## 2023-12-12 VITALS — BP 110/70 | HR 94 | Temp 98.1°F | Resp 16 | Ht 64.0 in | Wt 143.1 lb

## 2023-12-12 DIAGNOSIS — M810 Age-related osteoporosis without current pathological fracture: Secondary | ICD-10-CM | POA: Diagnosis not present

## 2023-12-12 DIAGNOSIS — Z0000 Encounter for general adult medical examination without abnormal findings: Secondary | ICD-10-CM

## 2023-12-12 DIAGNOSIS — E782 Mixed hyperlipidemia: Secondary | ICD-10-CM

## 2023-12-12 DIAGNOSIS — M25561 Pain in right knee: Secondary | ICD-10-CM

## 2023-12-12 DIAGNOSIS — I10 Essential (primary) hypertension: Secondary | ICD-10-CM

## 2023-12-12 DIAGNOSIS — M25562 Pain in left knee: Secondary | ICD-10-CM

## 2023-12-12 LAB — COMPREHENSIVE METABOLIC PANEL WITH GFR
ALT: 19 U/L (ref 0–35)
AST: 21 U/L (ref 0–37)
Albumin: 4.3 g/dL (ref 3.5–5.2)
Alkaline Phosphatase: 57 U/L (ref 39–117)
BUN: 16 mg/dL (ref 6–23)
CO2: 29 meq/L (ref 19–32)
Calcium: 8.8 mg/dL (ref 8.4–10.5)
Chloride: 104 meq/L (ref 96–112)
Creatinine, Ser: 0.82 mg/dL (ref 0.40–1.20)
GFR: 70.73 mL/min (ref >60.00)
Glucose, Bld: 105 mg/dL — ABNORMAL HIGH (ref 70–99)
Potassium: 4.3 meq/L (ref 3.5–5.1)
Sodium: 140 meq/L (ref 135–145)
Total Bilirubin: 0.5 mg/dL (ref 0.2–1.2)
Total Protein: 7.1 g/dL (ref 6.0–8.3)

## 2023-12-12 LAB — LIPID PANEL
Cholesterol: 236 mg/dL — ABNORMAL HIGH (ref 0–200)
HDL: 61.2 mg/dL (ref >39.00)
LDL Cholesterol: 152 mg/dL — ABNORMAL HIGH (ref 0–99)
NonHDL: 174.48
Total CHOL/HDL Ratio: 4
Triglycerides: 110 mg/dL (ref 0.0–149.0)
VLDL: 22 mg/dL (ref 0.0–40.0)

## 2023-12-12 LAB — VITAMIN D 25 HYDROXY (VIT D DEFICIENCY, FRACTURES): VITD: 37.66 ng/mL (ref 30.00–100.00)

## 2023-12-12 MED ORDER — ROSUVASTATIN CALCIUM 10 MG PO TABS
10.0000 mg | ORAL_TABLET | Freq: Every day | ORAL | 3 refills | Status: AC
Start: 2023-12-12 — End: ?

## 2023-12-12 NOTE — Assessment & Plan Note (Signed)
 We discussed CV benefits of statins. Her 10 years ASCVD risk is 13%. She would like to hold on pharmacologic treatment for now. Further recommendation will be given according to lipid panel result.

## 2023-12-12 NOTE — Patient Instructions (Addendum)
 A few things to remember from today's visit:  Routine general medical examination at a health care facility  Mixed hyperlipidemia - Plan: Comprehensive metabolic panel with GFR, Lipid panel  Essential (primary) hypertension  Age-related osteoporosis without current pathological fracture - Plan: VITAMIN D  25 Hydroxy (Vit-D Deficiency, Fractures)  Arthralgia of both lower legs  No changes today. Continue monitoring blood pressure regularly.  If you need refills for medications you take chronically, please call your pharmacy. Do not use My Chart to request refills or for acute issues that need immediate attention. If you send a my chart message, it may take a few days to be addressed, specially if I am not in the office.  Please be sure medication list is accurate. If a new problem present, please set up appointment sooner than planned today.

## 2023-12-12 NOTE — Progress Notes (Signed)
 HPI: Hannah Jennings is a 73 y.o. female  with PMHx significant for GERD, osteoporosis, hyperlipidemia, hypertension, depression, and OA , who is here today for her routine physical.  Last CPE: 12/11/2022.   Exercise: Active with gardening and walking her dog.  Diet: Home cooked meals with vegetables daily.  Sleep: 6-7 hours per night.  Smoking: Former smoker; quit in 45's.  Alcohol consumption: 1 glass of wine 3-4x/week Dental care:Yes Vision: Current.  Chronic medical problems:  Hypertension  Medications: Benazepril  10 mg daily   Good compliance and tolerance.  BP readings at home: mostly at goal, 120's/70's. Since 12/01/23 had SBP 148 and 150.   She started recording her BP on log due to occasional mild headaches.  Reports a BP of 148/82 during headache.  Negative for  exertional chest pain, dyspnea,  focal weakness, or edema. Reports occasional little headache.  Lab Results  Component Value Date   CREATININE 0.84 12/11/2022   BUN 13 12/11/2022   NA 139 12/11/2022   K 4.4 12/11/2022   CL 102 12/11/2022   CO2 29 12/11/2022   Osteoporosis Condition is followed by her gynecologist.  Currently on Risedronate  150 mg daily with good tolerance.  She is also on vitamin D  supplementation and has increased Ca+ supplementation via her diet.   Hyperlipidemia No meds currently.  Managed with healthy diet/exercise.  Tolerating well, no adverse side effects. Declined statin therapy.   Lab Results  Component Value Date   CHOL 211 (H) 12/11/2022   HDL 58.10 12/11/2022   LDLCALC 123 (H) 12/11/2022   TRIG 149.0 12/11/2022   CHOLHDL 4 12/11/2022   She is on Cymbalta  60 mg daily to treat depression and lower extremity arthralgias.  In the past arthralgias reoccurred when she tried to wean off Cymbalta .     12/12/2023    8:25 AM 09/05/2023    9:01 AM 05/15/2023    7:57 AM 12/11/2022    7:55 AM 07/30/2022   12:00 PM  Depression screen PHQ 2/9  Decreased Interest 0 0 0 0  0  Down, Depressed, Hopeless 0 0 0 0 0  PHQ - 2 Score 0 0 0 0 0  Altered sleeping 0   0 1  Tired, decreased energy 0   0 0  Change in appetite 0   0 0  Feeling bad or failure about yourself  0   0 0  Trouble concentrating 0   0 0  Moving slowly or fidgety/restless 0   0 0  Suicidal thoughts 0   0 0  PHQ-9 Score 0   0 1  Difficult doing work/chores Not difficult at all   Not difficult at all Not difficult at all      12/12/2023    8:26 AM 12/11/2022    7:55 AM 08/28/2020    1:11 PM  GAD 7 : Generalized Anxiety Score  Nervous, Anxious, on Edge 0 0 0  Control/stop worrying 0 0 0  Worry too much - different things 0 0 0  Trouble relaxing 0 0 0  Restless 0 0 0  Easily annoyed or irritable 0 0 0  Afraid - awful might happen 0 0 0  Total GAD 7 Score 0 0 0  Anxiety Difficulty Not difficult at all Not difficult at all Not difficult at all    Immunization History  Administered Date(s) Administered   Fluad Quad(high Dose 65+) 03/28/2021   Influenza, High Dose Seasonal PF 03/18/2016, 06/03/2017, 06/29/2018, 03/28/2019, 04/20/2020  Influenza-Unspecified 04/18/2022, 03/04/2023   Moderna Covid-19 Vaccine Bivalent Booster 98yrs & up 03/28/2021   Moderna Sars-Covid-2 Vaccination 08/07/2019, 09/04/2019, 04/20/2020, 04/18/2022   Pneumococcal Conjugate-13 08/14/2020   Pneumococcal Polysaccharide-23 06/25/2017   Tdap 12/08/2007, 12/24/2017, 05/20/2022   Zoster Recombinant(Shingrix) 07/02/2021, 10/26/2021   Health Maintenance  Topic Date Due   COVID-19 Vaccine (6 - 2024-25 season) 02/23/2023   INFLUENZA VACCINE  01/23/2024   MAMMOGRAM  08/21/2024   Medicare Annual Wellness (AWV)  09/04/2024   Colonoscopy  10/20/2031   DTaP/Tdap/Td (4 - Td or Tdap) 05/20/2032   Pneumococcal Vaccine: 50+ Years  Completed   DEXA SCAN  Completed   Hepatitis C Screening  Completed   Zoster Vaccines- Shingrix  Completed   HPV VACCINES  Aged Out   Meningococcal B Vaccine  Aged Out   She has no acute concerns  today.  Review of Systems  Constitutional:  Negative for activity change, appetite change and fever.  HENT:  Negative for mouth sores, sore throat and trouble swallowing.   Eyes:  Negative for redness and visual disturbance.  Respiratory:  Negative for cough, shortness of breath and wheezing.   Cardiovascular:  Negative for chest pain and leg swelling.  Gastrointestinal:  Negative for abdominal pain, nausea and vomiting.  Endocrine: Negative for cold intolerance, heat intolerance, polydipsia, polyphagia and polyuria.  Genitourinary:  Negative for decreased urine volume, dysuria and hematuria.  Musculoskeletal:  Negative for gait problem and myalgias.  Skin:  Negative for color change and rash.  Allergic/Immunologic: Negative for environmental allergies.  Neurological:  Positive for headaches (described as mild and not frequent). Negative for seizures, syncope and weakness.  Hematological:  Negative for adenopathy. Does not bruise/bleed easily.  Psychiatric/Behavioral:  Negative for confusion. The patient is not nervous/anxious.   All other systems reviewed and are negative.  Current Outpatient Medications on File Prior to Visit  Medication Sig Dispense Refill   benazepril  (LOTENSIN ) 10 MG tablet Take 1 tablet (10 mg total) by mouth daily. 90 tablet 3   cholecalciferol (VITAMIN D ) 1000 UNITS tablet Take 1,000 Units by mouth daily.      desonide  (DESOWEN ) 0.05 % cream Apply topically daily as needed. 30 g 1   DULoxetine  (CYMBALTA ) 60 MG capsule TAKE 1 CAPSULE(60 MG) BY MOUTH DAILY 90 capsule 3   estradiol  (ESTRACE ) 0.1 MG/GM vaginal cream Place 1/2 gram in the vagina and a pea size amount to the urethra at bedtime two to three times a week. 42.5 g 1   risedronate  (ACTONEL ) 150 MG tablet TAKE 1 TABLET BY MOUTH EVERY MONTH WITH WATER AND ON AN EMPTY STOMACH. DO NOT LIE DOWN FOR NEXT 30 MINUTES AFTER TAKING* 3 tablet 3   No current facility-administered medications on file prior to visit.    Past Medical History:  Diagnosis Date   Cancer (HCC) 2018   basal cell--chest and left ear   Oral lichen planus    --hx of oral--pt. states for years   Osteoporosis 03-2012   T-score 1.5/2.8     Past Surgical History:  Procedure Laterality Date   RETINAL TEAR REPAIR CRYOTHERAPY Right    right foot surgery Right 03/23/2018   salivatory gland  1990   Blocked salivatory gland removed   SKIN CANCER EXCISION     TONSILLECTOMY AND ADENOIDECTOMY  Age 68   TUBAL LIGATION  1980    No Known Allergies  Family History  Problem Relation Age of Onset   Hypertension Mother    Atrial fibrillation Mother  Dementia Mother    Hypertension Father    Cancer Father        colon   Neuropathy Sister    Diabetes Maternal Grandmother    Cancer Paternal Grandmother     Social History   Socioeconomic History   Marital status: Married    Spouse name: Not on file   Number of children: 2   Years of education: 16   Highest education level: Bachelor's degree (e.g., BA, AB, BS)  Occupational History   Occupation: retired  Tobacco Use   Smoking status: Former    Current packs/day: 0.00    Types: Cigarettes    Quit date: 01/26/1988    Years since quitting: 35.9    Passive exposure: Past   Smokeless tobacco: Never  Vaping Use   Vaping status: Never Used  Substance and Sexual Activity   Alcohol use: Yes    Alcohol/week: 2.0 standard drinks of alcohol    Types: 2 Glasses of wine per week   Drug use: No   Sexual activity: Not Currently    Partners: Male    Birth control/protection: Surgical    Comment: BTL- less than 5, after 16, no abnormal pap, no STD, no DES  Other Topics Concern   Not on file  Social History Narrative   HH 3   3 dogs   Retired Scientist, research (physical sciences)   2 grandchildren   Social Drivers of Corporate investment banker Strain: Low Risk  (12/08/2023)   Overall Financial Resource Strain (CARDIA)    Difficulty of Paying Living Expenses: Not hard at all  Food Insecurity: No Food  Insecurity (12/08/2023)   Hunger Vital Sign    Worried About Running Out of Food in the Last Year: Never true    Ran Out of Food in the Last Year: Never true  Transportation Needs: No Transportation Needs (12/08/2023)   PRAPARE - Administrator, Civil Service (Medical): No    Lack of Transportation (Non-Medical): No  Physical Activity: Insufficiently Active (12/08/2023)   Exercise Vital Sign    Days of Exercise per Week: 1 day    Minutes of Exercise per Session: 10 min  Stress: No Stress Concern Present (12/08/2023)   [employer] of Occupational Health - Occupational Stress Questionnaire    Feeling of Stress: Only a little  Social Connections: Moderately Isolated (12/08/2023)   Social Connection and Isolation Panel    Frequency of Communication with Friends and Family: Three times a week    Frequency of Social Gatherings with Friends and Family: Once a week    Attends Religious Services: Never    Database administrator or Organizations: No    Attends Engineer, structural: Not on file    Marital Status: Married    Vitals:   12/12/23 0756  BP: 110/70  Pulse: 94  Resp: 16  Temp: 98.1 F (36.7 C)  SpO2: 96%   Body mass index is 24.57 kg/m.  Wt Readings from Last 3 Encounters:  12/12/23 143 lb 2 oz (64.9 kg)  09/05/23 144 lb (65.3 kg)  05/15/23 143 lb (64.9 kg)    Physical Exam Vitals and nursing note reviewed.  Constitutional:      General: She is not in acute distress.    Appearance: She is well-developed.  HENT:     Head: Normocephalic and atraumatic.     Right Ear: Tympanic membrane, ear canal and external ear normal.     Left Ear: Tympanic membrane, ear canal  and external ear normal.     Mouth/Throat:     Mouth: Mucous membranes are moist.     Pharynx: Oropharynx is clear. Uvula midline.   Eyes:     Extraocular Movements: Extraocular movements intact.     Conjunctiva/sclera: Conjunctivae normal.     Pupils: Pupils are equal, round, and  reactive to light.   Neck:     Thyroid : No thyroid  mass or thyromegaly.   Cardiovascular:     Rate and Rhythm: Normal rate and regular rhythm.     Pulses:          Dorsalis pedis pulses are 2+ on the right side and 2+ on the left side.       Posterior tibial pulses are 2+ on the right side and 2+ on the left side.     Heart sounds: No murmur heard. Pulmonary:     Effort: Pulmonary effort is normal. No respiratory distress.     Breath sounds: Normal breath sounds.  Abdominal:     Palpations: Abdomen is soft. There is no hepatomegaly or mass.     Tenderness: There is no abdominal tenderness.  Genitourinary:    Comments: Deferred to gyn.  Musculoskeletal:     Comments: No signs of synovitis appreciated.  Lymphadenopathy:     Cervical: No cervical adenopathy.     Upper Body:     Right upper body: No supraclavicular adenopathy.     Left upper body: No supraclavicular adenopathy.   Skin:    General: Skin is warm.     Findings: No erythema or rash.   Neurological:     General: No focal deficit present.     Mental Status: She is alert and oriented to person, place, and time.     Cranial Nerves: No cranial nerve deficit.     Coordination: Coordination normal.     Gait: Gait normal.     Deep Tendon Reflexes:     Reflex Scores:      Bicep reflexes are 2+ on the right side and 2+ on the left side.      Patellar reflexes are 2+ on the right side and 2+ on the left side.  Psychiatric:        Mood and Affect: Mood and affect normal.   ASSESSMENT AND PLAN: Hannah Jennings was here today annual physical examination.  Orders Placed This Encounter  Procedures   Comprehensive metabolic panel with GFR   Lipid panel   VITAMIN D  25 Hydroxy (Vit-D Deficiency, Fractures)   Lab Results  Component Value Date   CHOL 236 (H) 12/12/2023   HDL 61.20 12/12/2023   LDLCALC 152 (H) 12/12/2023   TRIG 110.0 12/12/2023   CHOLHDL 4 12/12/2023  The 10-year ASCVD risk score (Arnett DK,  et al., 2019) is: 13.2%   Values used to calculate the score:     Age: 53 years     Clincally relevant sex: Female     Is Non-Hispanic African American: No     Diabetic: No     Tobacco smoker: No     Systolic Blood Pressure: 110 mmHg     Is BP treated: Yes     HDL Cholesterol: 61.2 mg/dL     Total Cholesterol: 236 mg/dL  Lab Results  Component Value Date   NA 140 12/12/2023   CL 104 12/12/2023   K 4.3 12/12/2023   CO2 29 12/12/2023   BUN 16 12/12/2023   CREATININE 0.82 12/12/2023   GFR  70.73 12/12/2023   CALCIUM 8.8 12/12/2023   ALBUMIN 4.3 12/12/2023   GLUCOSE 105 (H) 12/12/2023   Lab Results  Component Value Date   ALT 19 12/12/2023   AST 21 12/12/2023   ALKPHOS 57 12/12/2023   BILITOT 0.5 12/12/2023   Lab Results  Component Value Date   VD25OH 37.66 12/12/2023   Routine general medical examination at a health care facility Assessment & Plan: We discussed the importance of regular physical activity and healthy diet for prevention of chronic illness and/or complications. Preventive guidelines reviewed. Vaccination up to date. Ca++ through her diet and vit D supplementation to continue. Following with gyn regularly for her female preventive care. Next CPE in a year.   Mixed hyperlipidemia Assessment & Plan: We discussed CV benefits of statins. Her 10 years ASCVD risk is 13%. She would like to hold on pharmacologic treatment for now. Further recommendation will be given according to lipid panel result.  Orders: -     Comprehensive metabolic panel with GFR; Future -     Lipid panel; Future  Essential (primary) hypertension Assessment & Plan: BP today adequately controlled. Seldom home SBP's 140s and 150s but most 120s/70s. For now continue benazepril  10 mg daily and low-salt diet. Continue monitoring BP regularly. Eye exam is current. As far as BP is stable, we can continue annual follow-ups.   Age-related osteoporosis without current pathological  fracture Assessment & Plan: This problem is followed by gynecologist. Currently she is on Actonel  150 mg monthly. She has DEXA scheduled. Continue fall precautions and adequate calcium and vitamin D  supplementation. 25 OH vitamin D  added to labs today.  Orders: -     VITAMIN D  25 Hydroxy (Vit-D Deficiency, Fractures); Future  Arthralgia of both lower legs Assessment & Plan: Problem is well controlled since she has been on Cymbalta  60 mg.  In the past, she has tried to stop medication and pain has reoccured.    Return in 1 year (on 12/11/2024) for Labs, CPE, chronic problems. I, Bernita Bristle, acting as a scribe for Schylar Allard [place], MD., have documented all relevant documentation on the behalf of Jonah Nestle [place], MD, as directed by   while in the presence of Morganna Styles [place], MD.  I, Raymona Boss [place], MD, have reviewed all documentation for this visit. The documentation on 12/12/23 for the exam, diagnosis, procedures, and orders are all accurate and complete.  Jamicheal Heard G. [place], MD  [facility]. [place] office.

## 2023-12-12 NOTE — Assessment & Plan Note (Addendum)
 Problem is well controlled since she has been on Cymbalta  60 mg.  In the past, she has tried to stop medication and pain has reoccured.

## 2023-12-12 NOTE — Assessment & Plan Note (Addendum)
 BP today adequately controlled. Seldom home SBP's 140s and 150s but most 120s/70s. For now continue benazepril  10 mg daily and low-salt diet. Continue monitoring BP regularly. Eye exam is current. As far as BP is stable, we can continue annual follow-ups.

## 2023-12-12 NOTE — Assessment & Plan Note (Signed)
 This problem is followed by gynecologist. Currently she is on Actonel  150 mg monthly. She has DEXA scheduled. Continue fall precautions and adequate calcium and vitamin D  supplementation. 25 OH vitamin D  added to labs today.

## 2023-12-12 NOTE — Assessment & Plan Note (Signed)
 We discussed the importance of regular physical activity and healthy diet for prevention of chronic illness and/or complications. Preventive guidelines reviewed. Vaccination up to date. Ca++ through her diet and vit D supplementation to continue. Following with gyn regularly for her female preventive care. Next CPE in a year.

## 2023-12-26 ENCOUNTER — Other Ambulatory Visit: Payer: Self-pay | Admitting: Family Medicine

## 2023-12-26 DIAGNOSIS — I10 Essential (primary) hypertension: Secondary | ICD-10-CM

## 2024-01-06 ENCOUNTER — Ambulatory Visit
Admission: RE | Admit: 2024-01-06 | Discharge: 2024-01-06 | Disposition: A | Discharge status: Home / Self Care | Source: Ambulatory Visit | Attending: Obstetrics and Gynecology | Admitting: Obstetrics and Gynecology

## 2024-01-06 ENCOUNTER — Other Ambulatory Visit: Payer: Medicare Other

## 2024-01-06 ENCOUNTER — Other Ambulatory Visit (HOSPITAL_BASED_OUTPATIENT_CLINIC_OR_DEPARTMENT_OTHER)

## 2024-01-06 DIAGNOSIS — Z1231 Encounter for screening mammogram for malignant neoplasm of breast: Secondary | ICD-10-CM

## 2024-01-09 ENCOUNTER — Encounter: Payer: Self-pay | Admitting: Advanced Practice Midwife

## 2024-01-28 ENCOUNTER — Other Ambulatory Visit (INDEPENDENT_AMBULATORY_CARE_PROVIDER_SITE_OTHER)

## 2024-01-28 DIAGNOSIS — E782 Mixed hyperlipidemia: Secondary | ICD-10-CM | POA: Diagnosis not present

## 2024-01-28 LAB — LIPID PANEL
Cholesterol: 163 mg/dL (ref 0–200)
HDL: 68.2 mg/dL (ref >39.00)
LDL Cholesterol: 77 mg/dL (ref 0–99)
NonHDL: 94.74
Total CHOL/HDL Ratio: 2
Triglycerides: 87 mg/dL (ref 0.0–149.0)
VLDL: 17.4 mg/dL (ref 0.0–40.0)

## 2024-01-28 LAB — HEPATIC FUNCTION PANEL
ALT: 44 U/L — ABNORMAL HIGH (ref 0–35)
AST: 34 U/L (ref 0–37)
Albumin: 4.5 g/dL (ref 3.5–5.2)
Alkaline Phosphatase: 63 U/L (ref 39–117)
Bilirubin, Direct: 0.1 mg/dL (ref 0.0–0.3)
Total Bilirubin: 0.5 mg/dL (ref 0.2–1.2)
Total Protein: 6.9 g/dL (ref 6.0–8.3)

## 2024-02-02 ENCOUNTER — Encounter: Payer: Self-pay | Admitting: Family Medicine

## 2024-02-08 ENCOUNTER — Ambulatory Visit: Payer: Self-pay | Admitting: Family Medicine

## 2024-02-08 DIAGNOSIS — R7401 Elevation of levels of liver transaminase levels: Secondary | ICD-10-CM

## 2024-03-13 ENCOUNTER — Other Ambulatory Visit: Payer: Self-pay | Admitting: Family Medicine

## 2024-03-13 DIAGNOSIS — F3341 Major depressive disorder, recurrent, in partial remission: Secondary | ICD-10-CM

## 2024-03-13 DIAGNOSIS — M25562 Pain in left knee: Secondary | ICD-10-CM

## 2024-03-14 ENCOUNTER — Other Ambulatory Visit: Payer: Self-pay | Admitting: Family Medicine

## 2024-03-14 DIAGNOSIS — F3341 Major depressive disorder, recurrent, in partial remission: Secondary | ICD-10-CM

## 2024-03-14 DIAGNOSIS — M25562 Pain in left knee: Secondary | ICD-10-CM

## 2024-05-10 ENCOUNTER — Other Ambulatory Visit (INDEPENDENT_AMBULATORY_CARE_PROVIDER_SITE_OTHER)

## 2024-05-10 DIAGNOSIS — R7401 Elevation of levels of liver transaminase levels: Secondary | ICD-10-CM | POA: Diagnosis not present

## 2024-05-10 LAB — HEPATIC FUNCTION PANEL
ALT: 73 U/L — ABNORMAL HIGH (ref 0–35)
AST: 50 U/L — ABNORMAL HIGH (ref 0–37)
Albumin: 4.4 g/dL (ref 3.5–5.2)
Alkaline Phosphatase: 69 U/L (ref 39–117)
Bilirubin, Direct: 0.1 mg/dL (ref 0.0–0.3)
Total Bilirubin: 0.5 mg/dL (ref 0.2–1.2)
Total Protein: 7.1 g/dL (ref 6.0–8.3)

## 2024-05-10 LAB — CBC
HCT: 42.3 % (ref 36.0–46.0)
Hemoglobin: 14.3 g/dL (ref 12.0–15.0)
MCHC: 33.8 g/dL (ref 30.0–36.0)
MCV: 88.5 fl (ref 78.0–100.0)
Platelets: 266 K/uL (ref 150.0–400.0)
RBC: 4.78 Mil/uL (ref 3.87–5.11)
RDW: 13 % (ref 11.5–15.5)
WBC: 5 K/uL (ref 4.0–10.5)

## 2024-05-11 ENCOUNTER — Ambulatory Visit: Payer: Self-pay | Admitting: Family Medicine

## 2024-05-11 DIAGNOSIS — R7401 Elevation of levels of liver transaminase levels: Secondary | ICD-10-CM

## 2024-05-11 LAB — HEPATITIS B SURFACE ANTIBODY,QUALITATIVE: Hep B S Ab: NONREACTIVE

## 2024-05-11 LAB — HEPATITIS B SURFACE ANTIGEN: Hepatitis B Surface Ag: NONREACTIVE

## 2024-05-17 ENCOUNTER — Ambulatory Visit (INDEPENDENT_AMBULATORY_CARE_PROVIDER_SITE_OTHER): Payer: Medicare Other | Admitting: Obstetrics and Gynecology

## 2024-05-17 ENCOUNTER — Encounter: Payer: Self-pay | Admitting: Obstetrics and Gynecology

## 2024-05-17 VITALS — BP 114/72 | HR 103 | Ht 64.5 in | Wt 140.0 lb

## 2024-05-17 DIAGNOSIS — N393 Stress incontinence (female) (male): Secondary | ICD-10-CM | POA: Diagnosis not present

## 2024-05-17 DIAGNOSIS — Z7983 Long term (current) use of bisphosphonates: Secondary | ICD-10-CM

## 2024-05-17 DIAGNOSIS — M8589 Other specified disorders of bone density and structure, multiple sites: Secondary | ICD-10-CM | POA: Diagnosis not present

## 2024-05-17 DIAGNOSIS — N952 Postmenopausal atrophic vaginitis: Secondary | ICD-10-CM

## 2024-05-17 DIAGNOSIS — Z5181 Encounter for therapeutic drug level monitoring: Secondary | ICD-10-CM

## 2024-05-17 DIAGNOSIS — Z87440 Personal history of urinary (tract) infections: Secondary | ICD-10-CM

## 2024-05-17 MED ORDER — ESTRADIOL 0.01 % VA CREA: TOPICAL_CREAM | VAGINAL | 2 refills | Status: AC

## 2024-05-17 NOTE — Progress Notes (Unsigned)
 GYNECOLOGY  VISIT   HPI: 73 y.o.   Married  Caucasian female   G2P2002 with Patient's last menstrual period was 06/24/2002 (approximate).   here for: 1 year med check- Estrace .   Patient is followed for vaginal atrophy and osteopenia.    Use of vaginal estrogen cream helps to reduce risk of her UTIs.   This estrogen cream is controlling her infections well.   Not sexually active.    No bleeding or spotting.    Taking Actonel  for osteopenia since 2019. She just took her last pill for this month, and she prefers to stop this at this time.   Previous use of Fosamax for many years, possibly even 10 years.     Calcium  pills causes nausea.    Has some urinary incontinence with cough or strain to pick something up.  No leak for no reason. Wears a pad.  Not doing Kegels regularly.    Will have a liver US  due to change in liver function.  She is off her statin due to this.    GYNECOLOGIC HISTORY: Patient's last menstrual period was 06/24/2002 (approximate). Contraception:  PMP, tubal  Menopausal hormone therapy:  Estrace , Premarin  creams.  Last 2 paps:  05/02/21 neg, 03/29/19 neg History of abnormal Pap or positive HPV:  no Mammogram:  01/06/24 Breast Density Cat B, BIRADS Cat 1 neg Dexa:  scheduled for 07/22/24.  Last study done 08/27/21 showed osteopenia of left hip and spine.          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 states for years   Osteoporosis 03-2012   T-score 1.5/2.8     Past Surgical History:  Procedure Laterality Date   RETINAL TEAR REPAIR CRYOTHERAPY Right    right foot surgery Right 03/23/2018   salivatory gland  1990   Blocked salivatory gland removed   SKIN CANCER EXCISION     TONSILLECTOMY AND ADENOIDECTOMY  Age 27   TUBAL LIGATION  1980    Current Outpatient Medications  Medication Sig Dispense Refill   benazepril  (LOTENSIN ) 10 MG tablet TAKE 1 TABLET(10 MG) BY MOUTH DAILY 90 tablet 3   cholecalciferol (VITAMIN D ) 1000 UNITS tablet Take 1,000 Units by mouth daily.      conjugated estrogens  (PREMARIN ) vaginal cream Vaginal     desonide  (DESOWEN ) 0.05 % cream Apply topically daily as needed. 30 g 1   DULoxetine  (CYMBALTA ) 60 MG capsule TAKE 1 CAPSULE(60 MG) BY  MOUTH DAILY 90 capsule 3   estradiol  (ESTRACE ) 0.1 MG/GM vaginal cream Place 1/2 gram in the vagina and a pea size amount to the urethra at bedtime two to three times a week. 42.5 g 1   risedronate  (ACTONEL ) 150 MG tablet TAKE 1 TABLET BY MOUTH EVERY MONTH WITH WATER AND ON AN EMPTY STOMACH. DO NOT LIE DOWN FOR NEXT 30 MINUTES AFTER TAKING* 3 tablet 3   rosuvastatin  (CRESTOR ) 10 MG tablet Take 1 tablet (10 mg total) by mouth daily. (Patient not taking: Reported on 05/17/2024) 90 tablet 3   No current facility-administered medications for this visit.     ALLERGIES: Patient has no known allergies.  Family History  Problem Relation Age of Onset   Hypertension Mother    Atrial fibrillation Mother    Dementia Mother    Hypertension Father    Cancer Father        colon   Neuropathy Sister    Diabetes Maternal Grandmother    Cancer Paternal Grandmother     Social History   Socioeconomic History   Marital status: Married    Spouse  name: Not on file   Number of children: 2   Years of education: 16   Highest education level: Bachelor's degree (e.g., BA, AB, BS)  Occupational History   Occupation: retired  Tobacco Use   Smoking status: Former    Current packs/day: 0.00    Types: Cigarettes    Quit date: 01/26/1988    Years since quitting: 36.3    Passive exposure: Past   Smokeless tobacco: Never  Vaping Use   Vaping status: Never Used  Substance and Sexual Activity   Alcohol use: Yes    Alcohol/week: 2.0 standard drinks of alcohol    Types: 2 Glasses of wine per week   Drug use: No   Sexual activity: Not Currently    Partners: Male    Birth control/protection: Surgical, Post-menopausal    Comment: BTL- less than 5, after 16, no abnormal pap, no STD, no DES  Other Topics Concern   Not on file  Social History Narrative   HH 3   3 dogs   Retired SCIENTIST, RESEARCH (PHYSICAL SCIENCES)   2 grandchildren   Social Drivers of Corporate Investment Banker Strain: Low Risk  (12/08/2023)   Overall Financial Resource Strain (CARDIA)    Difficulty of Paying Living Expenses: Not hard at all  Food Insecurity: No Food Insecurity (12/08/2023)   Hunger Vital Sign    Worried About Running Out of Food in the Last Year: Never true    Ran Out of Food in the Last Year: Never true  Transportation Needs: No Transportation Needs (12/08/2023)   PRAPARE - Administrator, Civil Service (Medical): No    Lack of Transportation (Non-Medical): No  Physical Activity: Insufficiently Active (12/08/2023)   Exercise Vital Sign    Days of Exercise per Week: 1 day    Minutes of Exercise per Session: 10 min  Stress: No Stress Concern Present (12/08/2023)   [employer] of Occupational Health - Occupational Stress Questionnaire    Feeling of Stress: Only a little  Social Connections: Moderately Isolated (12/08/2023)   Social Connection and Isolation Panel    Frequency of Communication with Friends and Family: Three times a week    Frequency of Social  Gatherings with Friends and Family: Once a week    Attends Religious Services: Never    Database Administrator or Organizations: No  Attends Banker Meetings: Not on file    Marital Status: Married  Intimate Partner Violence: Not At Risk (09/05/2023)   Humiliation, Afraid, Rape, and Kick questionnaire    Fear of Current or Ex-Partner: No    Emotionally Abused: No    Physically Abused: No    Sexually Abused: No    Review of Systems  All other systems reviewed and are negative.   PHYSICAL EXAMINATION:   BP 114/72 (BP Location: Left Arm, Patient Position: Sitting)   Pulse (!) 103   Ht 5' 4.5 (1.638 m)   Wt 140 lb (63.5 kg)   LMP 06/24/2002 (Approximate)   SpO2 98%   BMI 23.66 kg/m     General appearance: alert, cooperative and appears stated age Head: Normocephalic, without obvious abnormality, atraumatic Neck: no adenopathy, supple, symmetrical, trachea midline and thyroid  normal to inspection and palpation Lungs: clear to auscultation bilaterally Breasts: normal appearance, no masses or tenderness, No nipple retraction or dimpling, No nipple discharge or bleeding, No axillary or supraclavicular adenopathy Heart: regular rate and rhythm Abdomen: soft, non-tender, no masses,  no organomegaly Extremities: extremities normal, atraumatic, no cyanosis or edema Skin: Skin color, texture, turgor normal. No rashes or lesions Lymph nodes: Cervical, supraclavicular, and axillary nodes normal. No abnormal inguinal nodes palpated Neurologic: Grossly normal  Pelvic: External genitalia:  no lesions              Urethra:  normal appearing urethra with no masses, tenderness or lesions              Bartholins and Skenes: normal                 Vagina: normal appearing vagina with normal color and discharge, no lesions              Cervix: no lesions                Bimanual Exam:  Uterus:  normal size, contour, position, consistency, mobility, non-tender.                Adnexa: no mass, fullness, tenderness   Chaperone was present for exam:  Kari HERO, CMA  ASSESSMENT:   Atrophy of vagina. Hx UTIs.  Encounter for monitoring of vaginal estrogen cream.  Osteopenia.  Increased fracture risk.  On Actonel  since 2019.  BMD stable. Medication monitoring of Actonel .  Stress urinary incontinence.   PLAN:  Patient will stop Premarin  cream.  Use estradiol  cream 1/2 gram pv and pea size amount to urethra 2 - 3 times per week at hs.  I discussed potential effect on breast cancer.  Mammogram recommended yearly.  Self breast exam reviewed.   Prior dexa discussed along with her history of bisphosphonate use.   We reviewed increased risk of atypical fracture wit prolonged used of bisphosphonates.  She would like to stop the Actonel .  Her dexa is scheduled for 07/22/24.  Consider Prolia if treatment is needed. Ca/vit D/weight bearing exercise reviewed.  Fall risk reduction also discussed.   Kegel's reviewed.  Pelvic floor therapy and surgery for stress incontinence briefly mentioned.    Return in one year for breast and pelvic exam and follow up.   30 min  total time was spent for this patient encounter, including preparation, face-to-face counseling with the patient, coordination of care, and documentation of the encounter.

## 2024-05-17 NOTE — Patient Instructions (Addendum)
 Kegel Exercises There are many reasons your provider may suggest Kegel exercises. This video will teach you how to do Kegel exercises. To view the content, go to this web address: [email or web address]  This video will expire on: 06/04/2025. If you need access to this video following this date, please reach out to the healthcare provider who assigned it to you. This information is not intended to replace advice given to you by your health care provider. Make sure you discuss any questions you have with your health care provider. Elsevier Patient Education  2024 Elsevier Inc.  Kegel Exercises  Kegel exercises can help strengthen your pelvic floor muscles. The pelvic floor is a group of muscles that support your rectum, small intestine, and bladder. In females, pelvic floor muscles also help support the uterus. These muscles help you control the flow of urine and stool (feces). Kegel exercises are painless and simple. They do not require any equipment. Your provider may suggest Kegel exercises to: Improve bladder and bowel control. Improve sexual response. Improve weak pelvic floor muscles after surgery to remove the uterus (hysterectomy) or after pregnancy, in females. Improve weak pelvic floor muscles after prostate gland removal or surgery, in males. Kegel exercises involve squeezing your pelvic floor muscles. These are the same muscles you squeeze when you try to stop the flow of urine or keep from passing gas. The exercises can be done while sitting, standing, or lying down, but it is best to vary your position. Ask your health care provider which exercises are safe for you. Do exercises exactly as told by your health care provider and adjust them as directed. Do not begin these exercises until told by your health care provider. Exercises How to do Kegel exercises: Squeeze your pelvic floor muscles tight. You should feel a tight lift in your rectal area. If you are a female, you  should also feel a tightness in your vaginal area. Keep your stomach, buttocks, and legs relaxed. Hold the muscles tight for up to 10 seconds. Breathe normally. Relax your muscles for up to 10 seconds. Repeat as told by your health care provider. Repeat this exercise daily as told by your health care provider. Continue to do this exercise for at least 4-6 weeks, or for as long as told by your health care provider. You may be referred to a physical therapist who can help you learn more about how to do Kegel exercises. Depending on your condition, your health care provider may recommend: Varying how long you squeeze your muscles. Doing several sets of exercises every day. Doing exercises for several weeks. Making Kegel exercises a part of your regular exercise routine. This information is not intended to replace advice given to you by your health care provider. Make sure you discuss any questions you have with your health care provider. Document Revised: 10/19/2020 Document Reviewed: 10/19/2020 Elsevier Patient Education  2024 Elsevier Inc.  Urinary Incontinence in Females: How to Manage Urinary incontinence, or UI, happens if you can't always control when you pee. If you think you have UI, it's important to talk to your health care provider. How does UI affect me? UI can make it hard to enjoy daily activities. It can affect your social life. It can also affect your mental health. What actions can I take to manage UI? Talk to your provider. There're things you can do and treatments that can help. Change your lifestyle  Quit smoking. Do not smoke, vape, or use nicotine or tobacco. Lose weight or keep  a healthy weight. Do pelvic floor muscle exercises as told. You may hear these called Kegel exercises. Stay active. Eat a healthy diet. Change your behavior Try bladder training. This may include using the bathroom at set times during the day. Use the bathroom every 3-4 hours, even if you  don't feel the need to pee. Try to empty your bladder of all pee every time you go. After peeing, wait a minute. Then try to pee again. Find ways to reduce bladder urges. This can include distraction techniques or controlled breathing exercises. Make sure you're in a relaxed position while peeing. If needed, wear pads to absorb pee that leaks. Get treatment Treatment for UI depends on the type of incontinence that you have and its cause. This may include: Medicines to relax the bladder muscles. Treatments, such as: Pulses of electricity to help change bladder reflexes (electrical nerve stimulation). A shot of collagen or carbon beads into the bladder opening. This can help thicken tissue and close the bladder opening. Botox injections to relax the bladder muscles. Surgery. Products such as: A pessary. This is a device to prevent pee leaks. It's placed in your vagina to help support your pelvic floor muscles. A catheter. This is a soft tube that's put into your urethra to drain pee from the bladder. The catheter may be connected to a bag that collects pee. Portable commodes, bedpans, or urinals.  Follow these instructions at home: Eating and drinking Change your diet as told. You may be asked to: Drink fluids in small amounts throughout the day instead of large amounts at one time. Use less caffeine or alcohol. Eat foods that are high in fiber, such as beans, whole grains, or fresh fruits and vegetables. General instructions Take your medicines only as told. Keep all follow-up visits. Your provider may need to change your treatment plan if needed. Where to find more information The Office on Women's Health: travellesson.ca The [employer]st[employer]ians [employer]necologists (ACOG): acog.org Contact a health care provide if: Your symptoms don't get better after treatment. Your symptoms get worse. You have new symptoms. This information is not intended to replace advice given to  you by your health care provider. Make sure you discuss any questions you have with your health care provider. Document Revised: 05/14/2023 Document Reviewed: 03/13/2023 Elsevier Patient Education  2025 [employer].  Calcium  in Foods Calcium  is a mineral in the body. Of all the minerals in your body, you have the most calcium . Most of the body's calcium  supply is stored in bones and teeth. Calcium  helps many parts of the body work, including: Blood and blood vessels. Nerves. Hormones. Muscles. Bones and teeth. When your calcium  stores are low, you may be at risk for low bone mass, bone loss, and broken bones. When you get enough calcium , it helps to support strong bones and teeth throughout your life. Calcium  is especially important for: Children during growth spurts. Females during adolescence. Females who are pregnant or breastfeeding. Females after their menstrual cycle stops (postmenopausal). Females whose menstrual cycle has stopped because of an eating disorder or regular intense exercise. People who can't eat or digest dairy products. People who eat a vegan diet. Recommended daily amounts of calcium : Females (ages 63 to 33): 1,000 mg per day. Females (ages 25 and older): 1,200 mg per day. Males (ages 48 to 61): 1,000 mg per day. Males (ages 73 and older): 1,200 mg per day. Females (ages 8 to 64): 1,300 mg per day. Males (ages 13  to 18): 1,300 mg per day. General information Eat foods that are high in calcium . Try to get most of your calcium  from food. Some people may benefit from taking calcium  supplements. Check with your health care provider or an expert in healthy eating called a dietitian before starting any calcium  supplements. Calcium  supplements may interact with certain medicines. Too much calcium  may cause other health problems, such as trouble pooping and kidney stones. For the body to absorb calcium , it needs vitamin D . Sources of vitamin D  include: Skin exposure  to direct sunlight. Foods, such as egg yolks, liver, mushrooms, saltwater fish, and fortified milk. Vitamin D  supplements. Check with your provider or dietitian before starting any vitamin D  supplements. The amount of calcium  that is absorbed in the body varies with type of food. Talk to a dietitian about what foods are best for you, especially if you are eat a vegan diet or don't eat dairy. What foods are high in calcium ?  Foods that are high in calcium  contain more than 100 milligrams per serving. Fruits Fortified orange juice or other fruit juice, 300 mg per 8 oz (237 mL) serving. Vegetables Collard greens, 260 mg per 1 cup (130 g) serving, cooked. Kale, 180 mg per 1 cup (118 g) serving, cooked. Bok choy, 180 mg per 1 cup (170 g) serving, cooked Grains Fortified frozen waffles, 200 mg in 2 waffles. Oatmeal, 180 mg in 1 cup (234 g) serving, cooked. Fortified white bread, 175 mg per slice. Meats and other proteins Sardines, canned with bones, 350 mg per 3.75 oz (92 g) serving. Salmon, canned with bones, 168 mg per 3 oz (85 g) serving. Canned shrimp, 125 mg per 3 oz (85 g) serving. Baked beans, 120 mg per 1 cup (266 g) serving. Tofu, firm, made with calcium  sulfate, 861 mg per  cup (126 g) serving. Dairy Yogurt, plain, low-fat, 448 mg per 1 cup (245 g) serving Nonfat milk, 300 mg per 1 cup (245 g) serving. American cheese, 145 mg per 1 oz (21 g) serving or 1 slice. Cheddar cheese, 200 mg per 1 oz (28 g) serving or 1 slice. Cottage cheese 2%, 125 mg per  cup (113 g) serving. Fortified soy, rice, or almond milk, 300 mg per 1 cup (237 mL) serving. Mozzarella, part skim, 210 mg per 1 oz (21 g) serving. The items listed above may not be a complete list of foods high in calcium . Actual amounts of calcium  may be different depending on processing. Contact a dietitian for more information. What foods are lower in calcium ? Foods that are lower in calcium  contain 50 mg or less per  serving. Fruits Apple, 1 medium, about 6 mg. Banana, 1 medium, about 12 mg. Vegetables Lettuce, 19 mg per 1 cup (35 g) serving. Tomato, 1 small, about 11 mg. Grains Rice, white, 8 mg per  cup (79 g) serving. Boiled potatoes, 14 mg per 1 cup (160 g) serving. White bread, 6 mg per slice. Meats and other proteins Egg, 24 mg per 1 egg (50 g). Red meat, 7 mg per 4 oz (80 g) serving. Chicken, 17 mg per 4 oz (113 g) serving. Fish, cod, or trout, 20 mg per 4 oz (140 g) serving. Dairy Cream cheese, regular, 14 mg per 1 Tbsp (15 g) serving. Brie cheese, 50 mg per 1 oz (32 g) serving. The items listed above may not be a complete list of foods lower in calcium . Actual amounts of calcium  may be different depending on processing. Contact a  dietitian for more information. This information is not intended to replace advice given to you by your health care provider. Make sure you discuss any questions you have with your health care provider. Document Revised: 03/08/2023 Document Reviewed: 03/08/2023 Elsevier Patient Education  2024 [employer].

## 2024-05-18 ENCOUNTER — Ambulatory Visit (HOSPITAL_COMMUNITY)
Admission: RE | Admit: 2024-05-18 | Discharge: 2024-05-18 | Disposition: A | Discharge status: Home / Self Care | Source: Ambulatory Visit | Attending: Family Medicine | Admitting: Family Medicine

## 2024-05-18 DIAGNOSIS — R7401 Elevation of levels of liver transaminase levels: Secondary | ICD-10-CM | POA: Insufficient documentation

## 2024-05-19 ENCOUNTER — Ambulatory Visit: Payer: Self-pay | Admitting: Family Medicine

## 2024-07-22 ENCOUNTER — Ambulatory Visit (HOSPITAL_BASED_OUTPATIENT_CLINIC_OR_DEPARTMENT_OTHER)
Admission: RE | Admit: 2024-07-22 | Discharge: 2024-07-22 | Disposition: A | Discharge status: Home / Self Care | Source: Ambulatory Visit | Attending: Obstetrics and Gynecology | Admitting: Obstetrics and Gynecology

## 2024-07-22 DIAGNOSIS — M8589 Other specified disorders of bone density and structure, multiple sites: Secondary | ICD-10-CM | POA: Insufficient documentation

## 2024-07-22 DIAGNOSIS — Z7983 Long term (current) use of bisphosphonates: Secondary | ICD-10-CM | POA: Insufficient documentation

## 2024-07-22 DIAGNOSIS — M8580 Other specified disorders of bone density and structure, unspecified site: Secondary | ICD-10-CM | POA: Diagnosis present

## 2024-07-22 DIAGNOSIS — Z5181 Encounter for therapeutic drug level monitoring: Secondary | ICD-10-CM | POA: Diagnosis present

## 2024-07-26 ENCOUNTER — Ambulatory Visit: Payer: Self-pay | Admitting: Obstetrics and Gynecology

## 2024-08-09 ENCOUNTER — Other Ambulatory Visit

## 2024-09-10 ENCOUNTER — Ambulatory Visit

## 2024-12-13 ENCOUNTER — Encounter: Admitting: Family Medicine

## 2024-12-27 ENCOUNTER — Encounter: Admitting: Family Medicine

## 2025-05-24 ENCOUNTER — Encounter: Admitting: Obstetrics and Gynecology
